# Patient Record
Sex: Female | Born: 1935 | Race: White | Hispanic: No | Smoking: Never smoker
Health system: Southern US, Community
[De-identification: ages and names within clinical notes are randomized; demographics above are authoritative.]

## PROBLEM LIST (undated history)

## (undated) DIAGNOSIS — R413 Other amnesia: Secondary | ICD-10-CM

## (undated) DIAGNOSIS — F419 Anxiety disorder, unspecified: Secondary | ICD-10-CM

## (undated) DIAGNOSIS — H3530 Unspecified macular degeneration: Secondary | ICD-10-CM

## (undated) DIAGNOSIS — M1990 Unspecified osteoarthritis, unspecified site: Secondary | ICD-10-CM

## (undated) DIAGNOSIS — E119 Type 2 diabetes mellitus without complications: Secondary | ICD-10-CM

## (undated) DIAGNOSIS — I499 Cardiac arrhythmia, unspecified: Secondary | ICD-10-CM

## (undated) HISTORY — PX: BACK SURGERY: SHX140

## (undated) HISTORY — PX: CHOLECYSTECTOMY: SHX55

## (undated) HISTORY — DX: Other amnesia: R41.3

## (undated) HISTORY — PX: ABDOMINAL HYSTERECTOMY: SHX81

## (undated) HISTORY — PX: CATARACT EXTRACTION: SUR2

---

## 1998-02-08 ENCOUNTER — Ambulatory Visit (HOSPITAL_COMMUNITY): Admission: RE | Admit: 1998-02-08 | Discharge: 1998-02-08 | Payer: Self-pay | Admitting: Internal Medicine

## 1998-09-25 ENCOUNTER — Encounter: Admission: RE | Admit: 1998-09-25 | Discharge: 1998-12-24 | Payer: Self-pay | Admitting: Internal Medicine

## 1998-11-28 ENCOUNTER — Other Ambulatory Visit: Admission: RE | Admit: 1998-11-28 | Discharge: 1998-11-28 | Payer: Self-pay | Admitting: Internal Medicine

## 1999-01-06 ENCOUNTER — Encounter (INDEPENDENT_AMBULATORY_CARE_PROVIDER_SITE_OTHER): Payer: Self-pay | Admitting: Specialist

## 1999-01-06 ENCOUNTER — Observation Stay (HOSPITAL_COMMUNITY): Admission: RE | Admit: 1999-01-06 | Discharge: 1999-01-07 | Payer: Self-pay | Admitting: *Deleted

## 1999-02-24 ENCOUNTER — Encounter: Admission: RE | Admit: 1999-02-24 | Discharge: 1999-05-25 | Payer: Self-pay | Admitting: Internal Medicine

## 1999-07-20 ENCOUNTER — Encounter: Admission: RE | Admit: 1999-07-20 | Discharge: 1999-10-18 | Payer: Self-pay | Admitting: Internal Medicine

## 1999-11-24 ENCOUNTER — Encounter: Admission: RE | Admit: 1999-11-24 | Discharge: 2000-02-22 | Payer: Self-pay | Admitting: Internal Medicine

## 2000-04-27 ENCOUNTER — Other Ambulatory Visit: Admission: RE | Admit: 2000-04-27 | Discharge: 2000-04-27 | Payer: Self-pay | Admitting: *Deleted

## 2000-09-02 ENCOUNTER — Ambulatory Visit (HOSPITAL_COMMUNITY): Admission: RE | Admit: 2000-09-02 | Discharge: 2000-09-02 | Payer: Self-pay | Admitting: Cardiology

## 2000-10-03 ENCOUNTER — Ambulatory Visit (HOSPITAL_COMMUNITY): Admission: RE | Admit: 2000-10-03 | Discharge: 2000-10-03 | Payer: Self-pay | Admitting: Cardiology

## 2001-06-26 ENCOUNTER — Other Ambulatory Visit: Admission: RE | Admit: 2001-06-26 | Discharge: 2001-06-26 | Payer: Self-pay | Admitting: *Deleted

## 2001-08-01 ENCOUNTER — Ambulatory Visit (HOSPITAL_COMMUNITY): Admission: RE | Admit: 2001-08-01 | Discharge: 2001-08-01 | Payer: Self-pay | Admitting: Gastroenterology

## 2001-08-01 ENCOUNTER — Encounter (INDEPENDENT_AMBULATORY_CARE_PROVIDER_SITE_OTHER): Payer: Self-pay | Admitting: Specialist

## 2004-06-04 ENCOUNTER — Emergency Department (HOSPITAL_COMMUNITY): Admission: EM | Admit: 2004-06-04 | Discharge: 2004-06-04 | Payer: Self-pay | Admitting: Emergency Medicine

## 2004-08-06 ENCOUNTER — Emergency Department (HOSPITAL_COMMUNITY): Admission: EM | Admit: 2004-08-06 | Discharge: 2004-08-06 | Payer: Self-pay | Admitting: Emergency Medicine

## 2004-09-28 ENCOUNTER — Emergency Department (HOSPITAL_COMMUNITY): Admission: EM | Admit: 2004-09-28 | Discharge: 2004-09-28 | Payer: Self-pay | Admitting: *Deleted

## 2004-10-14 ENCOUNTER — Ambulatory Visit: Payer: Self-pay | Admitting: Internal Medicine

## 2004-11-12 ENCOUNTER — Ambulatory Visit: Payer: Self-pay | Admitting: Cardiology

## 2004-11-13 ENCOUNTER — Ambulatory Visit: Payer: Self-pay

## 2005-06-16 ENCOUNTER — Other Ambulatory Visit: Admission: RE | Admit: 2005-06-16 | Discharge: 2005-06-16 | Payer: Self-pay | Admitting: Obstetrics and Gynecology

## 2008-09-25 ENCOUNTER — Encounter: Admission: RE | Admit: 2008-09-25 | Discharge: 2008-09-25 | Payer: Self-pay | Admitting: Otolaryngology

## 2008-12-05 ENCOUNTER — Ambulatory Visit (HOSPITAL_COMMUNITY): Admission: RE | Admit: 2008-12-05 | Discharge: 2008-12-05 | Payer: Self-pay | Admitting: Otolaryngology

## 2008-12-05 ENCOUNTER — Encounter (INDEPENDENT_AMBULATORY_CARE_PROVIDER_SITE_OTHER): Payer: Self-pay | Admitting: Interventional Radiology

## 2008-12-14 ENCOUNTER — Emergency Department (HOSPITAL_COMMUNITY): Admission: EM | Admit: 2008-12-14 | Discharge: 2008-12-14 | Payer: Self-pay | Admitting: Emergency Medicine

## 2009-02-19 ENCOUNTER — Encounter: Admission: RE | Admit: 2009-02-19 | Discharge: 2009-02-19 | Payer: Self-pay | Admitting: Otolaryngology

## 2009-03-28 ENCOUNTER — Ambulatory Visit: Payer: Self-pay | Admitting: Vascular Surgery

## 2009-12-31 DIAGNOSIS — R079 Chest pain, unspecified: Secondary | ICD-10-CM | POA: Insufficient documentation

## 2009-12-31 DIAGNOSIS — I2510 Atherosclerotic heart disease of native coronary artery without angina pectoris: Secondary | ICD-10-CM

## 2010-01-06 ENCOUNTER — Ambulatory Visit: Payer: Self-pay | Admitting: Internal Medicine

## 2010-01-06 DIAGNOSIS — I10 Essential (primary) hypertension: Secondary | ICD-10-CM

## 2010-01-06 LAB — CONVERTED CEMR LAB
BUN: 9 mg/dL (ref 6–23)
Glucose, Bld: 131 mg/dL — ABNORMAL HIGH (ref 70–99)
Potassium: 4.3 meq/L (ref 3.5–5.1)

## 2010-01-08 ENCOUNTER — Telehealth (INDEPENDENT_AMBULATORY_CARE_PROVIDER_SITE_OTHER): Payer: Self-pay | Admitting: *Deleted

## 2010-01-15 ENCOUNTER — Telehealth (INDEPENDENT_AMBULATORY_CARE_PROVIDER_SITE_OTHER): Payer: Self-pay | Admitting: *Deleted

## 2010-01-15 ENCOUNTER — Telehealth: Payer: Self-pay | Admitting: Internal Medicine

## 2010-01-19 ENCOUNTER — Ambulatory Visit (HOSPITAL_COMMUNITY)
Admission: RE | Admit: 2010-01-19 | Discharge: 2010-01-19 | Payer: Self-pay | Source: Home / Self Care | Admitting: Cardiology

## 2010-01-19 ENCOUNTER — Ambulatory Visit: Payer: Self-pay | Admitting: Cardiology

## 2010-07-17 ENCOUNTER — Telehealth: Payer: Self-pay | Admitting: Internal Medicine

## 2010-07-17 DIAGNOSIS — J984 Other disorders of lung: Secondary | ICD-10-CM | POA: Insufficient documentation

## 2010-07-29 ENCOUNTER — Ambulatory Visit: Payer: Self-pay | Admitting: Internal Medicine

## 2010-08-13 NOTE — Progress Notes (Signed)
Summary: pt hasquestions regarding test on Monday  Phone Note Call from Patient Call back at Home Phone [phone]   Caller: Patient Reason for Call: Talk to Nurse, Talk to Doctor Summary of Call: pt has questions regarding medication and about scan she is having monday. She is in research study the best time for you to call is between 4p - 5p Initial call taken by: Omer Jack,  January 15, 2010 11:38 AM  Follow-up for Phone Call        Vivan in research spoke w/pt Meredith Staggers, RN  January 15, 2010 5:54 PM

## 2010-08-13 NOTE — Progress Notes (Signed)
  Phone Note Outgoing Call   Summary of Call: Pt was randomized to coronary CTA for Promise Study and will have on July 11,2011 at 2pm. Pt was given instructions for coronary CTA. Pt will take morning insulin and eat a good meal. Pt is not to eat 4 hrs prior to test. Pt can drink water. Pt verbalized understanding. Pt does have latex allergy and radiology notified of allergy.

## 2010-08-13 NOTE — Progress Notes (Signed)
  Phone Note Outgoing Call   Summary of Call: I returned patient's phone call. Pt is to start Cozaar per Dr. Prescott Gum orders to lower blood pressure.Pt can take Xanax prior to procedure to help pt relax. Pt verbalized understanding.

## 2010-08-13 NOTE — Progress Notes (Signed)
Summary: sch CT scan  Phone Note Outgoing Call   Call placed by: Meredith Staggers, RN,  July 17, 2010 5:23 PM Call placed to: Patient Summary of Call: called pt to let her know it was time to do a CT scan to f/u on the lung nodule, she is aware, order placed  New Problems: LUNG NODULE (ICD-518.89)   New Problems: LUNG NODULE (ICD-518.89)

## 2010-08-13 NOTE — Assessment & Plan Note (Signed)
Summary: EC6/ GD   Primary Provider:  Larita Fife, MD  CC:  Pain at (L) Breast; PALPS.  History of Present Illness: Lauren Thomas is 76 y/o woman who was referred by Dr. Cyndia Bent for further evaluation of CP and palpitations.   Has h/o diabetes, obesity, HLand panic attacks. Underwent cath in 2002 with Dr. Donnie Aho after Myoview showed questionable anterior defect. Mild luminal irregualties but no obstructive cornary disease. Normal LVEF. Also wore a monitor in 2006 for palpitations which was normal despite multiple episodes of "palpitations". Had echo in 2006 which was also normal.   Over past 6 months she notes that she is having episodes of chest pain under her left breast. Typically happens every day. Not exertional.  No nocturnal angina. Comes and goes; seems to improve if she burps or has her back rubbed. Has rotator cuff problems as well and is getting injections with Dr. Ethelene Hal. Not overly active due to arthritis and weight. About to start exercise program with Dr. Kinnie Scales.   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 Occasional palpitations when she has a panic attack. No syncope. Notes that her BP has recently been high. PCP started low-dose lisinopril but developed a cough.    Allergies (verified): 1)  ! Sulfa 2)  ! Penicillin  Past History:  Past Surgical History: Last updated: 12/31/2009 Vaginal hysterectomy. Lumbar laminectomy. Cholecystectomy Knee Arthroplasty-Total - Left  Family History: Last updated: 12/31/2009 Family History of Cancer:  Family History of Coronary Artery Disease:  Family History of Diabetes:  Family History of Hypertension:   Social History: Last updated: 12/31/2009 Tobacco Use - No.  Alcohol Use - yes  Risk Factors: Smoking Status: never (12/31/2009)  Past Medical History: 1. Chest discomfort  -- mild CAD per cath '02 2. Type 2 diabetes mellitus, insulin-dependent. 3. Morbid obesity. 4.  Osteoarthritis. 5. Recent sinus and tooth abscess. 6. Possible dyslipidemia in the past. 7. Palpitations       --holter monitor and echo normal 2006 8. Hyperlipidemia  Family History: Reviewed history from 12/31/2009 and no changes required. Family History of Cancer:  Family History of Coronary Artery Disease:  Family History of Diabetes:  Family History of Hypertension:   Social History: Reviewed history from 12/31/2009 and no changes required. Tobacco Use - No.  Alcohol Use - yes  Review of Systems       As per HPI and past medical history; otherwise all systems negative.   Vital Signs:  Patient profile:   76 year old female Height:      66 inches Weight:      245 pounds BMI:     39.69 Pulse rate:   84 / minute BP sitting:   146 / 75  (right arm) Cuff size:   large  Vitals Entered By: Stanton Kidney, EMT-P (January 06, 2010 10:56 AM)  Physical Exam  General:  Obese. No aciute distress.. no resp difficulty HEENT: normal Neck: supple. no JVD. Carotids 2+ bilat; no bruits. No lymphadenopathy or thryomegaly appreciated. Cor: PMI nondisplaced. Regular rate & rhythm. No rubs, gallops, murmur. Lungs: clear Abdomen: obese soft, nontender, nondistended. Good bowel sounds. Extremities: no cyanosis, clubbing, rash, edema Neuro: alert & orientedx3, cranial nerves grossly intact. moves all 4 extremities w/o difficulty. affect pleasant    Impression & Recommendations:  Problem # 1:  CHEST PAIN-UNSPECIFIED (ICD-786.50) Mostly atypical but patient is diabetic with multiple risk factors. Will proceed with Lexiscan (can't walk on treadmill)  Myoview to evaluate. Will consider enrollment into Promise trial (stress test vs cardiac CT). Start ECASA 81. Given DM2 consider statin. She will d/w PCP.   Problem # 2:  HYPERTENSION, BENIGN (ICD-401.1) BP mildly elevated. Given h/o DM2 and intolerance of ACE-I will start Cozaar 25mg  once daily.   Other Orders: TLB-BMP (Basic Metabolic  Panel-BMET) (80048-METABOL)  Patient Instructions: 1)  Lab today 2)  Start Losartan 25mg  daily 3)  Your physician discussed the risks, benefits and indications for preventive aspirin therapy. It is recommended that you start (or continue) taking 81 mg of aspirin a day. Prescriptions: LOSARTAN POTASSIUM 25 MG TABS (LOSARTAN POTASSIUM) Take 1 tablet by mouth once a day  #30 x 6   Entered by:   Meredith Staggers, RN   Authorized by:   Dolores Patty, MD, [facility] -    Signed by:   Meredith Staggers, RN on 01/06/2010   Method used:   Electronically to        [facility] [address] [phone]* (retail)       [address] [place], [place]  [address]       Ph: [phone]       Fax: [phone]   RxID:   [id number]   Appended Document: ORDERS    Clinical Lists Changes  Orders: Added new Referral order of Cardiac CTA (Cardiac CTA) - Signed

## 2010-08-27 ENCOUNTER — Institutional Professional Consult (permissible substitution): Payer: Self-pay | Admitting: Emergency Medicine

## 2010-09-14 ENCOUNTER — Institutional Professional Consult (permissible substitution) (INDEPENDENT_AMBULATORY_CARE_PROVIDER_SITE_OTHER): Payer: Medicare PPO | Admitting: Emergency Medicine

## 2010-09-14 ENCOUNTER — Encounter: Payer: Self-pay | Admitting: Emergency Medicine

## 2010-09-14 DIAGNOSIS — J984 Other disorders of lung: Secondary | ICD-10-CM

## 2010-09-22 NOTE — Assessment & Plan Note (Signed)
Summary: pulmonary nodules   Visit Type:  Initial Consult Copy to:  Dr. Gala Romney Primary Provider/Referring Provider:  Larita Fife, MD  CC:  Pulmonary consult for abnormal CT chest...pulmonary nodule.  History of Present Illness: 09/14/10: Lauren Thomas is a 76 y/o woman with PMH of IDDM, who came to the clinic due to abnormal CT chest  She participated in a Cardiac research study in 7/11 and got a CT chest done which showed a 8x8 mm nodule in RLL- which remained to the same size in a repeat scan 6 months later- in 01/12.  She never smoked cigarretes, but was exposed to second hand smoke in her earlier years of life from her father and her first husband for about total of 27 yrs. She denies any cough, SOB,, chest pain.  Also denies any fever,  N/V,diarrhea, urinary abn, anorexia, wt loss, headache.    Preventive Screening-Counseling & Management  Alcohol-Tobacco     Smoking Status: never     Passive Smoke Exposure: yes  Current Medications (verified): 1)  Novolin 70/30 70-30 % Susp (Insulin Isophane & Regular) .... As Directed 2)  Alprazolam 0.25 Mg Tabs (Alprazolam) .... Take 1/2 Tablet Up To Three Times A Day  As Needed 3)  Hyoscyamine Sulfate 0.125 Mg Tabs (Hyoscyamine Sulfate) .... As Needed 4)  Aspirin 81 Mg Tbec (Aspirin) .... Take One Tablet By Mouth Daily 5)  Nitrofurantoin Macrocrystal 100 Mg Caps (Nitrofurantoin Macrocrystal) .Marland Kitchen.. 1 By Mouth Two Times A Day  Allergies (verified): 1)  ! Sulfa 2)  ! Penicillin  Past History:  Past Medical History: Last updated: 01/06/2010 1. Chest discomfort  -- mild CAD per cath '02 2. Type 2 diabetes mellitus, insulin-dependent. 3. Morbid obesity. 4. Osteoarthritis. 5. Recent sinus and tooth abscess. 6. Possible dyslipidemia in the past. 7. Palpitations       --holter monitor and echo normal 2006 8. Hyperlipidemia  Family History: Last updated: 12/31/2009 Family History of Cancer:  Family History of Coronary Artery Disease:    Family History of Diabetes:  Family History of Hypertension:   Family History: Reviewed history from 12/31/2009 and no changes required. Family History of Cancer:  Family History of Coronary Artery Disease:  Family History of Diabetes:  Family History of Hypertension:   Social History: Reviewed history from 12/31/2009 and no changes required. Tobacco Use - No.  Alcohol Use - yes Patient never smoked.  Positive history of passive tobacco smoke exposure.  Passive Smoke Exposure:  yes  Review of Systems       The patient complains of abdominal pain, nasal congestion/difficulty breathing through nose, anxiety, and depression.    Vital Signs:  Patient profile:   76 year old female Height:      66 inches (167.64 cm) Weight:      242 pounds (110.00 kg) BMI:     39.20 O2 Sat:      97 % on Room air Temp:     98.3 degrees F (36.83 degrees C) oral Pulse rate:   91 / minute BP sitting:   130 / 82  (right arm) Cuff size:   large  Vitals Entered By: Michel Bickers CMA (September 14, 2010 3:20 PM)  O2 Sat at Rest %:  97 O2 Flow:  Room air CC: Pulmonary consult for abnormal CT chest...pulmonary nodule Is Patient Diabetic? Yes Comments Medications reviewed with patient Michel Bickers [facility]  September 14, 2010 3:34 PM Michel Bickers CMA  September 14, 2010 3:34 PM  Physical Exam  Additional Exam:  Gen: Patient is in NAD, Pleasant. Eyes: PERRL, EOMI, No signs of anemia or jaundince. ENT: MMM, OP clear, No erythema, thrush or exudates. Neck: Supple, No carotid Bruits Resp: CTA- Bilaterally, No W/C/R. CVS: S1S2 RRR GI: Abdomen is soft. ND. Ext: No pedal edema, cyanosis or clubbing. GU: No CVA tenderness. Skin: No visible rashes, scars. Lymph: No palpable lymphadenopathy. Lauren: Moving all 4 extremities. Neuro: A&O X3, CN II - XII are grossly intact. Psych: Appropriate    Impression & Recommendations:  Problem # 1:  LUNG NODULE (ICD-518.89)  Lauren Thomas has an incidental findings of lung nodules  on CT Chest in 7/11 as per HPI.  - Considering her PMH and as she being a  non-smoker for life, will need a repeat CT chest done about 18 months from now  as she had a repeat scan done 6 months after the 1st one and the repeat scan showed no change in the nodule.  Orders: Consultation Level IV (08657)  Medications Added to Medication List This Visit: 1)  Nitrofurantoin Macrocrystal 100 Mg Caps (Nitrofurantoin macrocrystal) .Marland Kitchen.. 1 by mouth two times a day  Patient Instructions: 1)  Your lung nodules seen on CT scan need to be followed by a repeat CT after about 18 months from the last one in 1/12- which would be 01/2012. 2)  -So please make call the office in June 2013 to schedule for CT scan of your chest to look for any changes in the nodiules.  Appended Document: pulmonary nodules I have seen and examined Lauren Thomas with Dr Allena Katz. I agree with the database, plans as outlined above. She has a very medial RLL 8mm nodule that is stable on 57month f/u CT scan. She is a never-smoker, although she did have 2nd hand exposure. Based on the Fleischner Criteria, she needs a repeat scan in 18 - 24 months. I'd like to do this in 18 months, which would be in July 2013 (if stable this would be 2 yrs stability). If she develops symptoms then I will get a CT sooner. RSB

## 2010-10-19 LAB — BASIC METABOLIC PANEL
Chloride: 104 mEq/L (ref 96–112)
Creatinine, Ser: 0.72 mg/dL (ref 0.4–1.2)
GFR calc Af Amer: >60 mL/min (ref >60)
Potassium: 4.2 mEq/L (ref 3.5–5.1)

## 2010-10-19 LAB — DIFFERENTIAL
Eosinophils Absolute: 0.1 10*3/uL (ref 0.0–0.7)
Lymphs Abs: 2.5 10*3/uL (ref 0.7–4.0)
Monocytes Absolute: 0.6 10*3/uL (ref 0.1–1.0)
Monocytes Relative: 6 % (ref 3–12)
Neutrophils Relative %: 68 % (ref 43–77)

## 2010-10-19 LAB — CULTURE, ROUTINE-ABSCESS

## 2010-10-19 LAB — CBC
Hemoglobin: 14.5 g/dL (ref 12.0–15.0)
MCHC: 34.8 g/dL (ref 30.0–36.0)
RDW: 13.5 % (ref 11.5–15.5)

## 2010-10-20 LAB — CULTURE, ROUTINE-ABSCESS: Culture: NO GROWTH

## 2010-10-20 LAB — GLUCOSE, CAPILLARY: Glucose-Capillary: 126 mg/dL — ABNORMAL HIGH (ref 70–99)

## 2010-10-20 LAB — ANAEROBIC CULTURE

## 2010-10-20 LAB — CBC
MCHC: 34.4 g/dL (ref 30.0–36.0)
RBC: 4.77 MIL/uL (ref 3.87–5.11)
WBC: 9 10*3/uL (ref 4.0–10.5)

## 2010-10-20 LAB — FUNGUS CULTURE W SMEAR

## 2010-10-20 LAB — AFB CULTURE WITH SMEAR (NOT AT ARMC)

## 2010-11-27 NOTE — H&P (Signed)
[place]  Patient:    Lauren Thomas, Lauren Thomas                        MRN: [id number] Adm. Date:  09/02/00 Attending:  Darden Palmer., M.D. CC:         Ammie Dalton, M.D.   History and Physical  REASON FOR ADMISSION:  For cardiac catheterization.  HISTORY OF PRESENT ILLNESS:  This 76 year old female has a five to six-year history of diabetes mellitus and obesity.  She developed a recent sinus infection and abscess involving her left tooth, associated with jaw pain.  She developed episodic chest discomfort one month prior to admission, described as gas in her left chest, radiating to her left arm.  It would tend to occur at rest, and would be associated with excessive gas, belching, and would often be relieved with belching.  She did not have exertional activity-related symptoms.  This discomfort would last for up to 15 minutes.  An electrocardiogram was noted to be abnormal in Dr. Ammie Dalton office with anterolateral T-wave abnormality.  An adenosine Cardiolyte scan done on August 15, 2000, on a two-day protocol because of her weight showed a reversible anterior and apical defect compatible with ischemia.  A cardiac catheterization is now advised, to exclude coronary artery disease as the cause in this patient with diabetes mellitus and an abnormal Cardiolyte scan.  PAST MEDICAL HISTORY: 1. Diabetes mellitus for approximately five years, treated with insulin. 2. She has mild hyperlipidemia. 3. Obesity.  There is no history of hypertension.  PAST SURGICAL HISTORY: 1. Vaginal hysterectomy. 2. Lumbar laminectomy. 3. Cholecystectomy. 4. Left knee arthroscopy.  ALLERGIES:  No known drug allergies.  CURRENT MEDICATIONS: 1. Humulin 70/30, 50 units q.a.m., 30 units q. evening. 2. Sudafed p.r.n. 3. Doxycycline previously.  FAMILY HISTORY:  Father died of cancer of the prostate at age 61.  Mother died at age 48 of diabetes  mellitus and hypotension.  Three brothers living, one with a bypass surgery twice, and another brother has had bypass, and another brother has had heart disease.  She has three sons.  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  She currently lives in a house with another house mate.  She is a nonsmoker.  She does not use alcohol to excess.  After an absence, she started attending [facility] again. She drinks occasional caffeine.  REVIEW OF SYSTEMS:  Obese and quite large for several years.  She has had a sinus infection and jaw pain.  No difficulty swallowing.  She has had some dyspepsia and occasionally takes Pepcid for this.  No diarrhea or constipation.  She reportedly had diarrhea when taking Nexium.  She finds it difficult to walk because of pain involving her right knee.  She has no claudication, no TIAs, no significant edema.  No endocrine complaints.  No skin complaints.  No neurological complaints.  The remainder of the review of systems is unremarkable, except as noted above.  PHYSICAL EXAMINATION:  GENERAL:  She is an obese woman, who weighs 244 pounds.  VITAL SIGNS:  Blood pressure 118/70 sitting, 114/72 standing, pulse 80.  SKIN:  Warm and dry without mass or lesions.  HEENT:  EOMI.  PERRLA.  C&S clear.  Fundi unremarkable.  No diabetic retinopathy noted.  Pharynx negative.  NECK:  Supple without masses, thyromegaly, or bruits.  NODES:  Lymph nodes are unremarkable.  LUNGS:  Clear to A&P.  CARDIOVASCULAR:  Normal S1, S2.  No S3, S4, or murmur.  ABDOMEN:  Soft, nontender.  Very large.  No definite aneurysm is noted.  EXTREMITIES:  Femoral pulses are deep.  Distal pulses are 1+.  No edema noted. No Homans sign.  BREASTS/PELVIC:  Examinations not done because of the cardiac status.  A 12-lead electrocardiogram shows minor ST changes.  A chest x-ray reveals borderline  heart size.  IMPRESSION: 1. Chest discomfort with atypical features, with an abnormal Cardiolyte    scan, in a patient with diabetes mellitus and a family history.  Rule    out significant coronary artery disease. 2. Type 2 diabetes mellitus, insulin-dependent. 3. Morbid obesity. 4. Osteoarthritis. 5. Recent sinus and tooth abscess. 6. Possible dyslipidemia in the past.  RECOMMENDATIONS:  The patient will be scheduled to have a cardiac catheterization to exclude coronary artery disease.  The procedure was discussed with the patient fully, including the risks of myocardial infarction, death, or CVA.  She is agreeable and willing to proceed.  The possibility of stenting, bypass grafting, and risks of stenting were discussed with her, including restenosis and its management, which are increased somewhat in diabetics.  She is agreeable and willing to proceed.  Laboratory data was done as a preoperative evaluation. DD:  08/26/00 TD:  08/26/00 Job: [id number]

## 2010-11-27 NOTE — Cardiovascular Report (Signed)
[place]  Patient:    [name], [name]                      MRN: [id number] Proc. Date: 09/02/00 Adm. Date:  [id number] Disc. Date: [id number] Attending:  Norman Clay CC:         Ammie Dalton, M.D.   Cardiac Catheterization  HISTORY:  A 76 year old diabetic female, who has had chest discomfort with some atypical features.  She has had a Cardiolite scan showing some possible anterior wall ischemia.  COMMENTS ABOUT PROCEDURE:  The patient tolerated the procedure well without complications.  Please see the attached catheterization log for medications used and catheters used.  The right coronary artery was difficult to flex and required a No Torque for catheter.  She tolerated the procedure well.  HEMODYNAMIC DATA:  Aorta post contrast 160/78, LV post contrast 160/10.  ANGIOGRAPHIC DATA:  LEFT VENTRICULOGRAM:  The left ventriculogram performed in the 30 degree RAO projection.  The aortic valve was normal.  The mitral valve appears normal. The left ventricle appears normal in size.  The estimated ejection fraction was 60%.  Coronary arteries arise and distribute normally.  There was no significant coronary calcification present.  The left main coronary artery is normal.  The left anterior descending contains no significant stenoses.  There is aneurysmal dilatation in the proximal portion of the vessel and moderate irregularity is present.  The circumflex coronary artery has mild to moderate irregularity but no significant focal stenosis are noted.  The right coronary artery is a large dominant vessel with the posterior descending branch and appears normal.  IMPRESSION: 1. Mild coronary artery disease, no significant focal obstructive    stenoses noted. 2. Normal left ventricular function. DD:  09/02/00 TD:  09/03/00 Job: [id number]

## 2013-10-12 ENCOUNTER — Emergency Department (HOSPITAL_COMMUNITY): Payer: Medicare Other

## 2013-10-12 ENCOUNTER — Encounter (HOSPITAL_COMMUNITY): Payer: Self-pay | Admitting: Emergency Medicine

## 2013-10-12 ENCOUNTER — Inpatient Hospital Stay (HOSPITAL_COMMUNITY)
Admission: EM | Admit: 2013-10-12 | Discharge: 2013-10-15 | DRG: 287 | Disposition: A | Discharge status: Home / Self Care | Payer: Medicare Other | Attending: Cardiology | Admitting: Cardiology

## 2013-10-12 DIAGNOSIS — F411 Generalized anxiety disorder: Secondary | ICD-10-CM | POA: Diagnosis present

## 2013-10-12 DIAGNOSIS — E669 Obesity, unspecified: Secondary | ICD-10-CM | POA: Diagnosis present

## 2013-10-12 DIAGNOSIS — R7989 Other specified abnormal findings of blood chemistry: Secondary | ICD-10-CM | POA: Diagnosis present

## 2013-10-12 DIAGNOSIS — E119 Type 2 diabetes mellitus without complications: Secondary | ICD-10-CM | POA: Diagnosis present

## 2013-10-12 DIAGNOSIS — E1169 Type 2 diabetes mellitus with other specified complication: Secondary | ICD-10-CM | POA: Diagnosis present

## 2013-10-12 DIAGNOSIS — H3530 Unspecified macular degeneration: Secondary | ICD-10-CM | POA: Diagnosis present

## 2013-10-12 DIAGNOSIS — Z888 Allergy status to other drugs, medicaments and biological substances status: Secondary | ICD-10-CM

## 2013-10-12 DIAGNOSIS — I2510 Atherosclerotic heart disease of native coronary artery without angina pectoris: Secondary | ICD-10-CM | POA: Diagnosis present

## 2013-10-12 DIAGNOSIS — R079 Chest pain, unspecified: Secondary | ICD-10-CM

## 2013-10-12 DIAGNOSIS — R911 Solitary pulmonary nodule: Secondary | ICD-10-CM | POA: Diagnosis present

## 2013-10-12 DIAGNOSIS — R748 Abnormal levels of other serum enzymes: Secondary | ICD-10-CM | POA: Diagnosis present

## 2013-10-12 DIAGNOSIS — Z880 Allergy status to penicillin: Secondary | ICD-10-CM

## 2013-10-12 DIAGNOSIS — R778 Other specified abnormalities of plasma proteins: Secondary | ICD-10-CM | POA: Diagnosis present

## 2013-10-12 DIAGNOSIS — Z6838 Body mass index (BMI) 38.0-38.9, adult: Secondary | ICD-10-CM

## 2013-10-12 DIAGNOSIS — J984 Other disorders of lung: Secondary | ICD-10-CM | POA: Diagnosis present

## 2013-10-12 DIAGNOSIS — Z79899 Other long term (current) drug therapy: Secondary | ICD-10-CM

## 2013-10-12 DIAGNOSIS — R9431 Abnormal electrocardiogram [ECG] [EKG]: Secondary | ICD-10-CM

## 2013-10-12 DIAGNOSIS — I10 Essential (primary) hypertension: Secondary | ICD-10-CM | POA: Diagnosis present

## 2013-10-12 DIAGNOSIS — I499 Cardiac arrhythmia, unspecified: Secondary | ICD-10-CM | POA: Diagnosis present

## 2013-10-12 DIAGNOSIS — R12 Heartburn: Secondary | ICD-10-CM | POA: Diagnosis present

## 2013-10-12 DIAGNOSIS — R42 Dizziness and giddiness: Secondary | ICD-10-CM

## 2013-10-12 DIAGNOSIS — R002 Palpitations: Principal | ICD-10-CM

## 2013-10-12 DIAGNOSIS — Z794 Long term (current) use of insulin: Secondary | ICD-10-CM

## 2013-10-12 DIAGNOSIS — M129 Arthropathy, unspecified: Secondary | ICD-10-CM | POA: Diagnosis present

## 2013-10-12 HISTORY — DX: Anxiety disorder, unspecified: F41.9

## 2013-10-12 HISTORY — DX: Unspecified macular degeneration: H35.30

## 2013-10-12 HISTORY — DX: Type 2 diabetes mellitus without complications: E11.9

## 2013-10-12 HISTORY — DX: Unspecified osteoarthritis, unspecified site: M19.90

## 2013-10-12 HISTORY — DX: Cardiac arrhythmia, unspecified: I49.9

## 2013-10-12 LAB — PROTIME-INR
INR: 0.94 (ref 0.00–1.49)
Prothrombin Time: 12.4 seconds (ref 11.6–15.2)

## 2013-10-12 LAB — URINALYSIS, ROUTINE W REFLEX MICROSCOPIC
Bilirubin Urine: NEGATIVE
Glucose, UA: NEGATIVE mg/dL
Hgb urine dipstick: NEGATIVE
Ketones, ur: NEGATIVE mg/dL
Nitrite: NEGATIVE
Protein, ur: NEGATIVE mg/dL
Specific Gravity, Urine: 1.012 (ref 1.005–1.030)
Urobilinogen, UA: 0.2 mg/dL (ref 0.0–1.0)
pH: 5.5 (ref 5.0–8.0)

## 2013-10-12 LAB — COMPREHENSIVE METABOLIC PANEL WITH GFR
ALT: 16 U/L (ref 0–35)
AST: 19 U/L (ref 0–37)
Albumin: 3.4 g/dL — ABNORMAL LOW (ref 3.5–5.2)
Alkaline Phosphatase: 64 U/L (ref 39–117)
BUN: 11 mg/dL (ref 6–23)
CO2: 23 meq/L (ref 19–32)
Calcium: 9.1 mg/dL (ref 8.4–10.5)
Chloride: 99 meq/L (ref 96–112)
Creatinine, Ser: 0.67 mg/dL (ref 0.50–1.10)
GFR calc Af Amer: >90 mL/min
GFR calc non Af Amer: 83 mL/min — ABNORMAL LOW
Glucose, Bld: 216 mg/dL — ABNORMAL HIGH (ref 70–99)
Potassium: 4.4 meq/L (ref 3.7–5.3)
Sodium: 137 meq/L (ref 137–147)
Total Bilirubin: 0.6 mg/dL (ref 0.3–1.2)
Total Protein: 7.3 g/dL (ref 6.0–8.3)

## 2013-10-12 LAB — CBC WITH DIFFERENTIAL/PLATELET
Basophils Absolute: 0 K/uL (ref 0.0–0.1)
Basophils Relative: 0 % (ref 0–1)
Eosinophils Absolute: 0.1 K/uL (ref 0.0–0.7)
Eosinophils Relative: 1 % (ref 0–5)
HCT: 42.1 % (ref 36.0–46.0)
Hemoglobin: 15.1 g/dL — ABNORMAL HIGH (ref 12.0–15.0)
Lymphocytes Relative: 21 % (ref 12–46)
Lymphs Abs: 2.1 K/uL (ref 0.7–4.0)
MCH: 30.8 pg (ref 26.0–34.0)
MCHC: 35.9 g/dL (ref 30.0–36.0)
MCV: 85.9 fL (ref 78.0–100.0)
Monocytes Absolute: 0.5 K/uL (ref 0.1–1.0)
Monocytes Relative: 5 % (ref 3–12)
Neutro Abs: 7.4 K/uL (ref 1.7–7.7)
Neutrophils Relative %: 73 % (ref 43–77)
Platelets: 178 K/uL (ref 150–400)
RBC: 4.9 MIL/uL (ref 3.87–5.11)
RDW: 13.9 % (ref 11.5–15.5)
WBC: 10.2 K/uL (ref 4.0–10.5)

## 2013-10-12 LAB — URINE MICROSCOPIC-ADD ON

## 2013-10-12 LAB — TROPONIN I
TROPONIN I: 0.49 ng/mL — AB (ref <0.30)
Troponin I: 0.73 ng/mL (ref <0.30)

## 2013-10-12 LAB — GLUCOSE, CAPILLARY
GLUCOSE-CAPILLARY: 214 mg/dL — AB (ref 70–99)
Glucose-Capillary: 199 mg/dL — ABNORMAL HIGH (ref 70–99)

## 2013-10-12 LAB — TSH: TSH: 1.14 u[IU]/mL (ref 0.350–4.500)

## 2013-10-12 MED ORDER — ASPIRIN 81 MG PO CHEW: 324.0000 mg | CHEWABLE_TABLET | ORAL | Status: AC

## 2013-10-12 MED ORDER — METOPROLOL TARTRATE 12.5 MG HALF TABLET
12.5000 mg | ORAL_TABLET | Freq: Two times a day (BID) | ORAL | Status: DC
  Administered 2013-10-12 – 2013-10-14 (×5): 12.5 mg via ORAL
  Filled 2013-10-12 (×8): qty 1

## 2013-10-12 MED ORDER — POLYETHYL GLYCOL-PROPYL GLYCOL 0.4-0.3 % OP SOLN: 1.0000 [drp] | Freq: Every day | OPHTHALMIC | Status: DC | PRN

## 2013-10-12 MED ORDER — INSULIN ASPART PROT & ASPART (70-30 MIX) 100 UNIT/ML ~~LOC~~ SUSP
40.0000 [IU] | Freq: Every day | SUBCUTANEOUS | Status: DC
  Administered 2013-10-12 – 2013-10-14 (×3): 40 [IU] via SUBCUTANEOUS
  Filled 2013-10-12 (×2): qty 10

## 2013-10-12 MED ORDER — ACETAMINOPHEN 325 MG PO TABS
650.0000 mg | ORAL_TABLET | ORAL | Status: DC | PRN
  Filled 2013-10-12: qty 2

## 2013-10-12 MED ORDER — ASPIRIN EC 81 MG PO TBEC
81.0000 mg | DELAYED_RELEASE_TABLET | Freq: Every day | ORAL | Status: DC
  Administered 2013-10-13 – 2013-10-14 (×2): 81 mg via ORAL
  Filled 2013-10-12 (×3): qty 1

## 2013-10-12 MED ORDER — PNEUMOCOCCAL VAC POLYVALENT 25 MCG/0.5ML IJ INJ
0.5000 mL | INJECTION | INTRAMUSCULAR | Status: DC
  Filled 2013-10-12: qty 0.5

## 2013-10-12 MED ORDER — ALPRAZOLAM 0.25 MG PO TABS
0.2500 mg | ORAL_TABLET | Freq: Two times a day (BID) | ORAL | Status: DC | PRN
  Administered 2013-10-13 – 2013-10-14 (×4): 0.25 mg via ORAL
  Filled 2013-10-12 (×4): qty 1

## 2013-10-12 MED ORDER — INSULIN ASPART 100 UNIT/ML ~~LOC~~ SOLN: 0.0000 [IU] | Freq: Every day | SUBCUTANEOUS | Status: DC

## 2013-10-12 MED ORDER — INSULIN ASPART PROT & ASPART (70-30 MIX) 100 UNIT/ML ~~LOC~~ SUSP: 50.0000 [IU] | Freq: Every day | SUBCUTANEOUS | Status: DC

## 2013-10-12 MED ORDER — HEPARIN (PORCINE) IN NACL 100-0.45 UNIT/ML-% IJ SOLN
1300.0000 [IU]/h | INTRAMUSCULAR | Status: DC
  Administered 2013-10-12: 1100 [IU]/h via INTRAVENOUS
  Administered 2013-10-13 (×2): 1300 [IU]/h via INTRAVENOUS
  Filled 2013-10-12 (×3): qty 250

## 2013-10-12 MED ORDER — HYOSCYAMINE SULFATE 0.125 MG PO TBDP
0.1250 mg | ORAL_TABLET | Freq: Four times a day (QID) | ORAL | Status: DC | PRN
  Filled 2013-10-12: qty 1

## 2013-10-12 MED ORDER — INSULIN ASPART PROT & ASPART (70-30 MIX) 100 UNIT/ML ~~LOC~~ SUSP
60.0000 [IU] | Freq: Every day | SUBCUTANEOUS | Status: DC
  Administered 2013-10-14: 60 [IU] via SUBCUTANEOUS
  Filled 2013-10-12: qty 10

## 2013-10-12 MED ORDER — INSULIN ASPART PROT & ASPART (70-30 MIX) 100 UNIT/ML ~~LOC~~ SUSP: 70.0000 [IU] | Freq: Every day | SUBCUTANEOUS | Status: DC

## 2013-10-12 MED ORDER — HEPARIN BOLUS VIA INFUSION
4000.0000 [IU] | Freq: Once | INTRAVENOUS | Status: AC
  Administered 2013-10-12: 4000 [IU] via INTRAVENOUS
  Filled 2013-10-12: qty 4000

## 2013-10-12 MED ORDER — NITROGLYCERIN 0.4 MG SL SUBL: 0.4000 mg | SUBLINGUAL_TABLET | SUBLINGUAL | Status: DC | PRN

## 2013-10-12 MED ORDER — INSULIN ASPART 100 UNIT/ML ~~LOC~~ SOLN
0.0000 [IU] | Freq: Three times a day (TID) | SUBCUTANEOUS | Status: DC
  Administered 2013-10-13 – 2013-10-14 (×3): 3 [IU] via SUBCUTANEOUS
  Administered 2013-10-14: 2 [IU] via SUBCUTANEOUS
  Administered 2013-10-14: 3 [IU] via SUBCUTANEOUS
  Administered 2013-10-15: 2 [IU] via SUBCUTANEOUS

## 2013-10-12 MED ORDER — POLYVINYL ALCOHOL 1.4 % OP SOLN
1.0000 [drp] | OPHTHALMIC | Status: DC | PRN
  Filled 2013-10-12: qty 15

## 2013-10-12 MED ORDER — ASPIRIN 300 MG RE SUPP: 300.0000 mg | RECTAL | Status: AC

## 2013-10-12 MED ORDER — ONDANSETRON HCL 4 MG/2ML IJ SOLN
4.0000 mg | Freq: Once | INTRAMUSCULAR | Status: AC
  Administered 2013-10-12: 4 mg via INTRAVENOUS
  Filled 2013-10-12: qty 2

## 2013-10-12 MED ORDER — ONDANSETRON HCL 4 MG/2ML IJ SOLN: 4.0000 mg | Freq: Four times a day (QID) | INTRAMUSCULAR | Status: DC | PRN

## 2013-10-12 MED ORDER — ASPIRIN 81 MG PO CHEW: 324.0000 mg | CHEWABLE_TABLET | Freq: Once | ORAL | Status: DC

## 2013-10-12 MED ORDER — GUAIFENESIN ER 600 MG PO TB12
600.0000 mg | ORAL_TABLET | Freq: Two times a day (BID) | ORAL | Status: DC | PRN
  Filled 2013-10-12: qty 1

## 2013-10-12 MED ORDER — ATORVASTATIN CALCIUM 10 MG PO TABS
10.0000 mg | ORAL_TABLET | Freq: Every day | ORAL | Status: DC
  Administered 2013-10-13 – 2013-10-14 (×2): 10 mg via ORAL
  Filled 2013-10-12 (×3): qty 1

## 2013-10-12 NOTE — H&P (Signed)
Patient ID: Lauren Thomas MRN: [id number], DOB/AGE: January 09, 1935   Admit date: 10/12/2013   Primary Physician: Dr Cyndia Bent Primary Cardiologist: Dr Gala Romney  HPI: 79 y/o female we have seen in the past. Her son is a Insurance underwriter. She had a cath in 2002 that revealed minor irregularities. She was seen again in July 2011 and had a coronary CT revealing mild to at most moderate Ca++ but no flow limiting lesions. She is obese and very sedentary. She has had a long history of palpations but has never had documented arrythmia. Early this am she got up to use the bathroom and went to check her pulse. She was not having palpitations, dizziness, or chest pain, she says she just frequently checks her pulse by palpitating her carotid. This morning she became concerned because she couldn't find her pulse. She called her primary care provider and was instructed to call EMS. EMS rhythm strips show ST elevation in lead 3 but this is not seen on several repeated 12 leads. The did admit to some vague epigastric discomfort when pressed. Her Troponin in the ER is 0.49.    Problem List: Past Medical History  Diagnosis Date  . Irregular heart rhythm   . Diabetes mellitus without complication   . Arthritis   . Macular degeneration   . Anxiety     Past Surgical History  Procedure Laterality Date  . Back surgery    . Abdominal hysterectomy    . Cholecystectomy       Allergies:  Allergies  Allergen Reactions  . Erythromycin     Heart flutter  . Penicillins     unknown  . Sulfonamide Derivatives     diarrhea  . Latex Rash  . Neosporin [Neomycin-Bacitracin Zn-Polymyx] Rash  . Polysporin [Bacitracin-Polymyxin B] Rash     Home Medications Current Facility-Administered Medications  Medication Dose Route Frequency Provider Last Rate Last Dose  . aspirin chewable tablet 324 mg  324 mg Oral Once Trixie Dredge, PA-C       Current Outpatient Prescriptions  Medication Sig Dispense Refill  . ALPRAZolam  (XANAX) 0.25 MG tablet Take 0.25 mg by mouth 2 (two) times daily as needed for anxiety.      . beta carotene w/minerals (OCUVITE) tablet Take 1 tablet by mouth daily.      . Cranberry-Vitamin C-Probiotic (AZO CRANBERRY) 250-30 MG TABS Take 2 tablets by mouth daily.      Marland Kitchen guaiFENesin (MUCINEX) 600 MG 12 hr tablet Take 600 mg by mouth 2 (two) times daily as needed for cough or to loosen phlegm.      . hyoscyamine (ANASPAZ) 0.125 MG TBDP disintergrating tablet Place 0.125 mg under the tongue every 6 (six) hours as needed for bladder spasms or cramping.      . insulin NPH-regular Human (NOVOLIN 70/30) (70-30) 100 UNIT/ML injection Inject 50-70 Units into the skin See admin instructions. 70 units in the morning and 50 units in the evening      . Polyethyl Glycol-Propyl Glycol (SYSTANE) 0.4-0.3 % SOLN Place 1 drop into both eyes daily as needed (titching/dry eye).      Marland Kitchen PRESCRIPTION MEDICATION Vaginal cream/wash prescribed from  Urologist Patient did not know the name of it and family has not been able to find it at any pharmacies.         FM history- M had an MI age 45   History   Social History  . Marital Status: Legally Separated    Spouse  Name: N/A    Number of Children: N/A  . Years of Education: N/A   Occupational History  . Not on file.   Social History Main Topics  . Smoking status: Never Smoker   . Smokeless tobacco: Never Used  . Alcohol Use: No  . Drug Use: No  . Sexual Activity: Not on file   Other Topics Concern  . Not on file   Social History Narrative  . No narrative on file     Review of Systems: General: negative for chills, fever, night sweats or weight changes.  Cardiovascular: negative for chest pain, dyspnea on exertion, edema, orthopnea, palpitations, paroxysmal nocturnal dyspnea or shortness of breath Dermatological: negative for rash Respiratory: negative for cough or wheezing Urologic: negative for hematuria Abdominal: negative for nausea, vomiting,  diarrhea, bright red blood per rectum, melena, or hematemesis Neurologic: negative for visual changes, syncope, or dizziness All other systems reviewed and are otherwise negative except as noted above.  Physical Exam: Blood pressure 143/72, pulse 87, temperature 97.9 F (36.6 C), temperature source Oral, resp. rate 16, SpO2 96.00%.  General appearance: alert, cooperative, no distress and morbidly obese Neck: no carotid bruit and no JVD Lungs: clear to auscultation bilaterally Heart: regular rate and rhythm Abdomen: obese, non tender Extremities: no edema, dry skin Pulses: diminnished, no FA bruit noted Skin: pale, dry Neurologic: Grossly normal    Labs:   Results for orders placed during the hospital encounter of 10/12/13 (from the past 24 hour(s))  CBC WITH DIFFERENTIAL     Status: Abnormal   Collection Time    10/12/13 12:55 PM      Result Value Ref Range   WBC 10.2  4.0 - 10.5 K/uL   RBC 4.90  3.87 - 5.11 MIL/uL   Hemoglobin 15.1 (*) 12.0 - 15.0 g/dL   HCT 16.142.1  09.636.0 - 04.546.0 %   MCV 85.9  78.0 - 100.0 fL   MCH 30.8  26.0 - 34.0 pg   MCHC 35.9  30.0 - 36.0 g/dL   RDW 40.913.9  81.111.5 - 91.415.5 %   Platelets 178  150 - 400 K/uL   Neutrophils Relative % 73  43 - 77 %   Neutro Abs 7.4  1.7 - 7.7 K/uL   Lymphocytes Relative 21  12 - 46 %   Lymphs Abs 2.1  0.7 - 4.0 K/uL   Monocytes Relative 5  3 - 12 %   Monocytes Absolute 0.5  0.1 - 1.0 K/uL   Eosinophils Relative 1  0 - 5 %   Eosinophils Absolute 0.1  0.0 - 0.7 K/uL   Basophils Relative 0  0 - 1 %   Basophils Absolute 0.0  0.0 - 0.1 K/uL  COMPREHENSIVE METABOLIC PANEL     Status: Abnormal   Collection Time    10/12/13 12:55 PM      Result Value Ref Range   Sodium 137  137 - 147 mEq/L   Potassium 4.4  3.7 - 5.3 mEq/L   Chloride 99  96 - 112 mEq/L   CO2 23  19 - 32 mEq/L   Glucose, Bld 216 (*) 70 - 99 mg/dL   BUN 11  6 - 23 mg/dL   Creatinine, Ser 7.820.67  0.50 - 1.10 mg/dL   Calcium 9.1  8.4 - 95.610.5 mg/dL   Total Protein 7.3   6.0 - 8.3 g/dL   Albumin 3.4 (*) 3.5 - 5.2 g/dL   AST 19  0 - 37 U/L  ALT 16  0 - 35 U/L   Alkaline Phosphatase 64  39 - 117 U/L   Total Bilirubin 0.6  0.3 - 1.2 mg/dL   GFR calc non Af Amer 83 (*) >90 mL/min   GFR calc Af Amer >90  >90 mL/min  TROPONIN I     Status: Abnormal   Collection Time    10/12/13 12:55 PM      Result Value Ref Range   Troponin I 0.49 (*) <0.30 ng/mL  URINALYSIS, ROUTINE W REFLEX MICROSCOPIC     Status: Abnormal   Collection Time    10/12/13  1:38 PM      Result Value Ref Range   Color, Urine YELLOW  YELLOW   APPearance CLEAR  CLEAR   Specific Gravity, Urine 1.012  1.005 - 1.030   pH 5.5  5.0 - 8.0   Glucose, UA NEGATIVE  NEGATIVE mg/dL   Hgb urine dipstick NEGATIVE  NEGATIVE   Bilirubin Urine NEGATIVE  NEGATIVE   Ketones, ur NEGATIVE  NEGATIVE mg/dL   Protein, ur NEGATIVE  NEGATIVE mg/dL   Urobilinogen, UA 0.2  0.0 - 1.0 mg/dL   Nitrite NEGATIVE  NEGATIVE   Leukocytes, UA TRACE (*) NEGATIVE  URINE MICROSCOPIC-ADD ON     Status: None   Collection Time    10/12/13  1:38 PM      Result Value Ref Range   Squamous Epithelial / LPF RARE  RARE   WBC, UA 0-2  <3 WBC/hpf   Bacteria, UA RARE  RARE     Radiology/Studies: Dg Chest 2 View  10/12/2013   CLINICAL DATA:  Heart palpitations.  EXAM: CHEST  2 VIEW  COMPARISON:  CT chest 07/29/2010.  FINDINGS: Lungs are clear. Heart size is normal. No pneumothorax or pleural effusion. Marked degenerative change about the shoulders is noted. There is also thoracic spondylosis.  IMPRESSION: No acute disease.   Electronically Signed   By: Drusilla Kanner M.D.   On: 10/12/2013 13:24    EKG:NSR TWI 1 AVL, inferior Qs- similar to 2011 EKG but TWI lead 1 and Q in AVF new.  ASSESSMENT AND PLAN:  Active Problems:   Elevated troponin   Abnormal finding on EKG   CAD, NATIVE VESSEL- mild CAD at cath 2002 and coronary CT 07/11   Diabetes mellitus type 2 in obese   OBESITY-MORBID   HYPERTENSION, BENIGN   LUNG NODULE-  evaluated 2012   Palpitations   PLAN: Admit, cycle Troponin, NPO for Myoview but will switch to cath if Troponin climb.    Deland Pretty, PA-C 10/12/2013, 3:13 PM  History and all data above reviewed.  Patient examined.  I agree with the findings as above.  The patient had symptoms that were quite atypical as described above.  However, she could have had a brief run of some tachyarrhythmia.  She did not have chest pain.  However, there appears to be some transient ST elevation in the inferior leads seen only on the rhythm strip from EMS.  EMS twelve lead and  ED 12 lead does not show any acute ST T wave changes.  However, her initial enzyme was negative.  The patient exam reveals COR:RRR, no rub  ,  Lungs: Clear  ,  Abd: Positive bowel sounds, no rebound no guarding, Ext No edema  .  All available labs, radiology testing, previous records reviewed. Agree with documented assessment and plan. Elevated troponin:  I don't know if this is spurious.  We will cycle  enzymes.  If her enzymes continue to rise she will need a cardiac cath.  Otherwise, she is set up for a Lexiscan Myoview in the AM.   Torsten Weniger  3:21 PM  10/12/2013

## 2013-10-12 NOTE — ED Provider Notes (Signed)
CSN: [id number]     Arrival date & time 10/12/13  1152 History   First MD Initiated Contact with Patient 10/12/13 1205     Chief Complaint  Patient presents with  . Palpitations     (Consider location/radiation/quality/duration/timing/severity/associated sxs/prior Treatment) HPI  Pt reports she got up to use the bathroom around 3am and felt lightheaded and dizzy, felt for her pulse and states she could not feel a pulse in her neck, felt that "it was rolling around on the floor and there was no rhythm."  States she usually has an irregular heart rate but that this was different - usually has 3 beats, then 6, then 3, then 1, etc - has been evaluated for this previously- this is the first time she could not palpate her own pulse.  These symptoms lasted approximately two hours and resolved.  She took a xanax with relief. She went back to sleep and woke up feeling well.  Also notes new heartburn, described as "feeling like I have to burp."  Attempted to see PCP today and was instructed to come to ED. Denies any chest pain, shortness of breath, back or abdominal pain, no nausea.  Has had mild cough only when exposed to tree pollen.  Has taken some mucinex, but no other medication changes or additions recently.   Past Medical History  Diagnosis Date  . Irregular heart rhythm   . Diabetes mellitus without complication   . Arthritis   . Macular degeneration   . Anxiety    Past Surgical History  Procedure Laterality Date  . Back surgery    . Abdominal hysterectomy    . Cholecystectomy     History reviewed. No pertinent family history. History  Substance Use Topics  . Smoking status: Never Smoker   . Smokeless tobacco: Never Used  . Alcohol Use: No   OB History   Grav Para Term Preterm Abortions TAB SAB Ect Mult Living                 Review of Systems  Constitutional: Negative for fever.  Respiratory: Positive for cough. Negative for shortness of breath.   Cardiovascular: Negative for  chest pain and leg swelling.  Gastrointestinal: Negative for nausea, vomiting, abdominal pain and diarrhea.  Neurological: Positive for dizziness and light-headedness.  All other systems reviewed and are negative.      Allergies  Erythromycin; Penicillins; Sulfonamide derivatives; Latex; Neosporin; and Polysporin  Home Medications   Current Outpatient Rx  Name  Route  Sig  Dispense  Refill  . ALPRAZolam (XANAX) 0.25 MG tablet   Oral   Take 0.25 mg by mouth 2 (two) times daily as needed for anxiety.         . beta carotene w/minerals (OCUVITE) tablet   Oral   Take 1 tablet by mouth daily.         . Cranberry-Vitamin C-Probiotic (AZO CRANBERRY) 250-30 MG TABS   Oral   Take 2 tablets by mouth daily.         Marland Kitchen. guaiFENesin (MUCINEX) 600 MG 12 hr tablet   Oral   Take 600 mg by mouth 2 (two) times daily as needed for cough or to loosen phlegm.         . hyoscyamine (ANASPAZ) 0.125 MG TBDP disintergrating tablet   Sublingual   Place 0.125 mg under the tongue every 6 (six) hours as needed for bladder spasms or cramping.         . insulin NPH-regular  Human (NOVOLIN 70/30) (70-30) 100 UNIT/ML injection   Subcutaneous   Inject 50-70 Units into the skin See admin instructions. 70 units in the morning and 50 units in the evening         . Polyethyl Glycol-Propyl Glycol (SYSTANE) 0.4-0.3 % SOLN   Both Eyes   Place 1 drop into both eyes daily as needed (titching/dry eye).         Marland Kitchen PRESCRIPTION MEDICATION      Vaginal cream/wash prescribed from  Urologist Patient did not know the name of it and family has not been able to find it at any pharmacies.          BP 139/73  Pulse 94  Temp(Src) 97.9 F (36.6 C) (Oral)  Resp 15  SpO2 97% Physical Exam  Nursing note and vitals reviewed. Constitutional: She appears well-developed and well-nourished. No distress.  HENT:  Head: Normocephalic and atraumatic.  Neck: Neck supple.  Cardiovascular: Normal rate and regular  rhythm.   Pulmonary/Chest: Effort normal and breath sounds normal. No respiratory distress. She has no wheezes. She has no rales.  Abdominal: Soft. She exhibits no distension. There is no tenderness. There is no rebound and no guarding.  Morbidly obese  Musculoskeletal: She exhibits no edema.  Neurological: She is alert.  Skin: She is not diaphoretic.    ED Course  Procedures (including critical care time) Labs Review Labs Reviewed  CBC WITH DIFFERENTIAL - Abnormal; Notable for the following:    Hemoglobin 15.1 (*)    All other components within normal limits  COMPREHENSIVE METABOLIC PANEL - Abnormal; Notable for the following:    Glucose, Bld 216 (*)    Albumin 3.4 (*)    GFR calc non Af Amer 83 (*)    All other components within normal limits  TROPONIN I - Abnormal; Notable for the following:    Troponin I 0.49 (*)    All other components within normal limits  URINALYSIS, ROUTINE W REFLEX MICROSCOPIC - Abnormal; Notable for the following:    Leukocytes, UA TRACE (*)    All other components within normal limits  URINE MICROSCOPIC-ADD ON   Imaging Review Dg Chest 2 View  10/12/2013   CLINICAL DATA:  Heart palpitations.  EXAM: CHEST  2 VIEW  COMPARISON:  CT chest 07/29/2010.  FINDINGS: Lungs are clear. Heart size is normal. No pneumothorax or pleural effusion. Marked degenerative change about the shoulders is noted. There is also thoracic spondylosis.  IMPRESSION: No acute disease.   Electronically Signed   By: Drusilla Kanner M.D.   On: 10/12/2013 13:24     EKG Interpretation   Date/Time:  Friday October 12 2013 11:58:44 EDT Ventricular Rate:  97 PR Interval:  161 QRS Duration: 94 QT Interval:  346 QTC Calculation: 439 R Axis:   12 Text Interpretation:  Sinus rhythm Nonspecific T abnormalities, lateral  leads Since last tracing T wave abnormality is new Confirmed by Effie Shy  MD,  ELLIOTT [phone]) on 10/12/2013 1:54:15 PM      12:51 PM Discussed pt with Dr Effie Shy who will  also see patient.   1:55 PM Troponin is elevated.  Have ordered aspirin.  Dr Effie Shy to see patient.  Have ordered cardiology consult.   2:03 PM Spoke with Trish.  Cardiology to see patient.   MDM   Final diagnoses:  Palpitations  Lightheadedness  Elevated troponin    Pt with lightheadedness/dizziness, palpitations this morning. Found to have elevated troponin.  Admitted to cardiology.  Trixie Dredge, PA-C 10/12/13 1525

## 2013-10-12 NOTE — Progress Notes (Addendum)
ANTICOAGULATION CONSULT NOTE - Initial Consult  Pharmacy Consult for heparin Indication: chest pain/ACS  Allergies  Allergen Reactions  . Erythromycin     Heart flutter  . Penicillins     unknown  . Sulfonamide Derivatives     diarrhea  . Latex Rash  . Neosporin [Neomycin-Bacitracin Zn-Polymyx] Rash  . Polysporin [Bacitracin-Polymyxin B] Rash    Patient Measurements: Height: 5\' 6"  (167.6 cm) Weight: 240 lb (108.863 kg) IBW/kg (Calculated) : 59.3 Heparin Dosing Weight: 84.5 kg  Vital Signs: Temp: 97.9 F (36.6 C) (04/03 1208) Temp src: Oral (04/03 1208) BP: 143/72 mmHg (04/03 1430) Pulse Rate: 87 (04/03 1430)  Labs:  Recent Labs  10/12/13 1255  HGB 15.1*  HCT 42.1  PLT 178  CREATININE 0.67  TROPONINI 0.49*    Estimated Creatinine Clearance: 74.7 ml/min (by C-G formula based on Cr of 0.67).   Medical History: Past Medical History  Diagnosis Date  . Irregular heart rhythm   . Diabetes mellitus without complication   . Arthritis   . Macular degeneration   . Anxiety     Medications:  See PTA medication list  Assessment: 79 y/o female who presents to the ED with palpitations found to have an elevated troponin. Pharmacy consulted to begin IV heparin. Plan is for myoview in the morning unless her enzymes continue to rise for which she would need a cath. No bleeding noted, CBC is normal. Spoke with patient and answered all her questions.  Goal of Therapy:  Heparin level 0.3-0.7 units/ml Monitor platelets by anticoagulation protocol: Yes   Plan:  - Heparin 4000 units IV bolus then 1100 units/hr - 6 hr heparin level - Daily heparin level and CBC - Monitor for s/sx of bleeding  [facility] SummitJennifer Haworth, 17[address] Rainbow BoulevardPharm.D., BCPS Clinical Pharmacist Pager: [phone] 10/12/2013 3:44 PM

## 2013-10-12 NOTE — Progress Notes (Signed)
Notified Rhonda Barrett of troponin of 0.73. She stated no plans to cath at this point. Pt denies chest pain. Ok to feed pt per [place]. Will cont to monitor

## 2013-10-12 NOTE — ED Notes (Signed)
Pt from home via [facility] with c/o palpitations starting at 4 am.  Pt took alprazolam at both 4 am and at 830 am.  Pt denies pain or any kind of feeling currently.  Given 324 mg aspirin.  Hx of irregular HR, not a-fib.  Pt in NAD A&O.

## 2013-10-12 NOTE — ED Notes (Signed)
Attempted report 

## 2013-10-12 NOTE — ED Provider Notes (Signed)
  Face-to-face evaluation   History: When she awoke this morning at 3:00 to urinate, she had a sensation of irregular heart beating. She denies chest pain, fever, chills, cough, nausea, or vomiting  Physical exam: Elderly patient, alert, calm, cooperative. Heart regular rate and rhythm. No murmur. Lungs clear to auscultation.  Medical screening examination/treatment/procedure(s) were conducted as a shared visit with non-physician practitioner(s) and myself.  I personally evaluated the patient during the encounter  Flint MelterElliott L Javarius Tsosie, MD 10/12/13 [phone]

## 2013-10-13 ENCOUNTER — Inpatient Hospital Stay (HOSPITAL_COMMUNITY): Payer: Medicare Other

## 2013-10-13 DIAGNOSIS — R079 Chest pain, unspecified: Secondary | ICD-10-CM

## 2013-10-13 DIAGNOSIS — R799 Abnormal finding of blood chemistry, unspecified: Secondary | ICD-10-CM

## 2013-10-13 LAB — CBC
HCT: 36.1 % (ref 36.0–46.0)
Hemoglobin: 12.7 g/dL (ref 12.0–15.0)
MCH: 30.7 pg (ref 26.0–34.0)
MCHC: 35.2 g/dL (ref 30.0–36.0)
MCV: 87.2 fL (ref 78.0–100.0)
Platelets: 169 10*3/uL (ref 150–400)
RBC: 4.14 MIL/uL (ref 3.87–5.11)
RDW: 14.2 % (ref 11.5–15.5)
WBC: 8.9 10*3/uL (ref 4.0–10.5)

## 2013-10-13 LAB — LIPID PANEL
Cholesterol: 155 mg/dL (ref 0–200)
HDL: 29 mg/dL — ABNORMAL LOW (ref >39)
LDL Cholesterol: 87 mg/dL (ref 0–99)
Total CHOL/HDL Ratio: 5.3 RATIO
Triglycerides: 194 mg/dL — ABNORMAL HIGH (ref <150)
VLDL: 39 mg/dL (ref 0–40)

## 2013-10-13 LAB — BASIC METABOLIC PANEL
BUN: 11 mg/dL (ref 6–23)
CO2: 25 mEq/L (ref 19–32)
Calcium: 8.7 mg/dL (ref 8.4–10.5)
Chloride: 101 mEq/L (ref 96–112)
Creatinine, Ser: 0.74 mg/dL (ref 0.50–1.10)
GFR calc Af Amer: >90 mL/min (ref >90)
GFR calc non Af Amer: 81 mL/min — ABNORMAL LOW (ref >90)
Glucose, Bld: 166 mg/dL — ABNORMAL HIGH (ref 70–99)
Potassium: 4 mEq/L (ref 3.7–5.3)
Sodium: 141 mEq/L (ref 137–147)

## 2013-10-13 LAB — GLUCOSE, CAPILLARY
GLUCOSE-CAPILLARY: 173 mg/dL — AB (ref 70–99)
GLUCOSE-CAPILLARY: 138 mg/dL — AB (ref 70–99)
Glucose-Capillary: 153 mg/dL — ABNORMAL HIGH (ref 70–99)
Glucose-Capillary: 167 mg/dL — ABNORMAL HIGH (ref 70–99)

## 2013-10-13 LAB — HEPARIN LEVEL (UNFRACTIONATED)
HEPARIN UNFRACTIONATED: 0.24 [IU]/mL — AB (ref 0.30–0.70)
HEPARIN UNFRACTIONATED: 0.1 [IU]/mL — AB (ref 0.30–0.70)
Heparin Unfractionated: 0.24 IU/mL — ABNORMAL LOW (ref 0.30–0.70)

## 2013-10-13 LAB — TROPONIN I: Troponin I: 0.42 ng/mL (ref <0.30)

## 2013-10-13 MED ORDER — TECHNETIUM TC 99M SESTAMIBI - CARDIOLITE: 30.0000 | Freq: Once | INTRAVENOUS | Status: DC | PRN

## 2013-10-13 MED ORDER — HEPARIN BOLUS VIA INFUSION
2000.0000 [IU] | Freq: Once | INTRAVENOUS | Status: AC
  Administered 2013-10-13: 2000 [IU] via INTRAVENOUS
  Filled 2013-10-13: qty 2000

## 2013-10-13 MED ORDER — TECHNETIUM TC 99M SESTAMIBI GENERIC - CARDIOLITE
10.0000 | Freq: Once | INTRAVENOUS | Status: AC | PRN
  Administered 2013-10-13: 10 via INTRAVENOUS

## 2013-10-13 MED ORDER — HEPARIN (PORCINE) IN NACL 100-0.45 UNIT/ML-% IJ SOLN
1800.0000 [IU]/h | INTRAMUSCULAR | Status: DC
  Administered 2013-10-14 (×2): 1800 [IU]/h via INTRAVENOUS
  Filled 2013-10-13 (×5): qty 250

## 2013-10-13 NOTE — Progress Notes (Signed)
ANTICOAGULATION CONSULT NOTE - Follow Up Consult  Pharmacy Consult for Heparin Indication: chest pain/ACS  Allergies  Allergen Reactions  . Erythromycin     Heart flutter  . Penicillins     unknown  . Sulfonamide Derivatives     diarrhea  . Latex Rash  . Neosporin [Neomycin-Bacitracin Zn-Polymyx] Rash  . Polysporin [Bacitracin-Polymyxin B] Rash    Patient Measurements: Height: 5\' 6"  (167.6 cm) Weight: 240 lb (108.863 kg) IBW/kg (Calculated) : 59.3 Heparin Dosing Weight: 84kg  Vital Signs: Temp: 97.7 F (36.5 C) (04/04 0607) Temp src: Oral (04/04 0607) BP: 146/60 mmHg (04/04 0607) Pulse Rate: 78 (04/04 0607)  Labs:  Recent Labs  10/12/13 1255 10/12/13 1548 10/12/13 2359 10/13/13 0337 10/13/13 0338 10/13/13 1111  HGB 15.1*  --   --   --  12.7  --   HCT 42.1  --   --   --  36.1  --   PLT 178  --   --   --  169  --   LABPROT  --  12.4  --   --   --   --   INR  --  0.94  --   --   --   --   HEPARINUNFRC  --   --  0.10*  --   --  0.24*  CREATININE 0.67  --   --   --  0.74  --   TROPONINI 0.49* 0.73*  --  0.42*  --   --     Estimated Creatinine Clearance: 74.7 ml/min (by C-G formula based on Cr of 0.74).   Medications:  Heparin @ 1300 units/hr  Assessment: 78yof continues on heparin for chest pain with positive troponins. Original plan was for a myoview, however, with worsening EKG changes she will need a cath. Plan is for cath tomorrow. Heparin level is slightly below goal despite rebolus and rate increase this morning. CBC is stable. No bleeding reported.  Goal of Therapy:  Heparin level 0.3-0.7 units/ml Monitor platelets by anticoagulation protocol: Yes   Plan:  1) Increase heparin to 1500 units/hr 2) Check 8 hour heparin level   Fredrik RiggerMarkle, Kaylon Laroche Sue 10/13/2013,11:59 AM

## 2013-10-13 NOTE — Progress Notes (Signed)
    Subjective:  Denies CP or dyspnea   Objective:  Filed Vitals:   10/12/13 1530 10/12/13 1631 10/12/13 2056 10/13/13 0607  BP: 114/59 109/80 127/58 146/60  Pulse: 89 96 82 78  Temp:  97.7 F (36.5 C) 97.6 F (36.4 C) 97.7 F (36.5 C)  TempSrc:  Oral Oral Oral  Resp: 20 19 16 16   Height:      Weight:      SpO2: 95% 98% 97% 97%    Intake/Output from previous day:  Intake/Output Summary (Last 24 hours) at 10/13/13 [id number] Last data filed at 10/12/13 2100  Gross per 24 hour  Intake      0 ml  Output    400 ml  Net   -400 ml    Physical Exam: Physical exam: Well-developed obese in no acute distress.  Skin is warm and dry.  HEENT is normal.  Neck is supple.  Chest is clear to auscultation with normal expansion.  Cardiovascular exam is regular rate and rhythm.  Abdominal exam nontender or distended. No masses palpated. Extremities show no edema. neuro grossly intact    Lab Results: Basic Metabolic Panel:  Recent Labs  46/96/2902/09/24 1255 10/13/13 0338  NA 137 141  K 4.4 4.0  CL 99 101  CO2 23 25  GLUCOSE 216* 166*  BUN 11 11  CREATININE 0.67 0.74  CALCIUM 9.1 8.7   CBC:  Recent Labs  10/12/13 1255 10/13/13 0338  WBC 10.2 8.9  NEUTROABS 7.4  --   HGB 15.1* 12.7  HCT 42.1 36.1  MCV 85.9 87.2  PLT 178 169   Cardiac Enzymes:  Recent Labs  10/12/13 1255 10/12/13 1548 10/13/13 0337  TROPONINI 0.49* 0.73* 0.42*     Assessment/Plan:  1 non-ST elevation myocardial infarction-the patient had chest pressure yesterday that she attributed to reflux. Her troponin is minimally elevated. Her electrocardiogram shows worsening lateral T-wave inversion. I think definitive evaluation is warranted. Plan to cancel nuclear study today. Proceed with cardiac catheterization tomorrow morning. The risks and benefits were discussed and the patient agrees to proceed. Continue aspirin, heparin, statin and beta blocker. 2 diabetes mellitus-continue insulin and follow  CBG. 3 hypertension-continue present medications.  Olga MillersBrian Crenshaw 10/13/2013, 8:08 AM

## 2013-10-13 NOTE — Progress Notes (Signed)
ANTICOAGULATION CONSULT NOTE - Follow Up Consult  Pharmacy Consult for Heparin Indication: chest pain/ACS  Allergies  Allergen Reactions  . Erythromycin     Heart flutter  . Penicillins     unknown  . Sulfonamide Derivatives     diarrhea  . Latex Rash  . Neosporin [Neomycin-Bacitracin Zn-Polymyx] Rash  . Polysporin [Bacitracin-Polymyxin B] Rash    Patient Measurements: Height: 5\' 6"  (167.6 cm) Weight: 240 lb (108.863 kg) IBW/kg (Calculated) : 59.3 Heparin Dosing Weight: 84kg  Vital Signs: Temp: 97.8 F (36.6 C) (04/04 2000) Temp src: Oral (04/04 1450) BP: 147/62 mmHg (04/04 2000) Pulse Rate: 80 (04/04 2000)  Labs:  Recent Labs  10/12/13 1255 10/12/13 1548 10/12/13 2359 10/13/13 0337 10/13/13 0338 10/13/13 1111 10/13/13 2035  HGB 15.1*  --   --   --  12.7  --   --   HCT 42.1  --   --   --  36.1  --   --   PLT 178  --   --   --  169  --   --   LABPROT  --  12.4  --   --   --   --   --   INR  --  0.94  --   --   --   --   --   HEPARINUNFRC  --   --  0.10*  --   --  0.24* 0.24*  CREATININE 0.67  --   --   --  0.74  --   --   TROPONINI 0.49* 0.73*  --  0.42*  --   --   --     Estimated Creatinine Clearance: 74.7 ml/min (by C-G formula based on Cr of 0.74).   Medications:  Heparin @ 1500 units/hr  Assessment: 78yof continues on heparin for chest pain with positive troponins. Original plan was for a myoview, however, with worsening EKG changes she will need a cath. Heparin level is slightly below goal despite rebolus and rate increase this morning. CBC is stable. No bleeding reported.  Goal of Therapy:  Heparin level 0.3-0.7 units/ml Monitor platelets by anticoagulation protocol: Yes   Plan:  1) Increase heparin to 1800 units/hr 2) Check 8 hour heparin level   Mickeal SkinnerFrens, Dakotah Heiman John 10/13/2013,9:18 PM

## 2013-10-13 NOTE — Progress Notes (Signed)
ANTICOAGULATION CONSULT NOTE - Follow Up Consult  Pharmacy Consult for Heparin  Indication: chest pain/ACS  Allergies  Allergen Reactions  . Erythromycin     Heart flutter  . Penicillins     unknown  . Sulfonamide Derivatives     diarrhea  . Latex Rash  . Neosporin [Neomycin-Bacitracin Zn-Polymyx] Rash  . Polysporin [Bacitracin-Polymyxin B] Rash    Patient Measurements: Height: 5\' 6"  (167.6 cm) Weight: 240 lb (108.863 kg) IBW/kg (Calculated) : 59.3 Heparin Dosing Weight: ~84 kg  Vital Signs: Temp: 97.6 F (36.4 C) (04/03 2056) Temp src: Oral (04/03 2056) BP: 127/58 mmHg (04/03 2056) Pulse Rate: 82 (04/03 2056)  Labs:  Recent Labs  10/12/13 1255 10/12/13 1548 10/12/13 2359  HGB 15.1*  --   --   HCT 42.1  --   --   PLT 178  --   --   LABPROT  --  12.4  --   INR  --  0.94  --   HEPARINUNFRC  --   --  0.10*  CREATININE 0.67  --   --   TROPONINI 0.49* 0.73*  --     Estimated Creatinine Clearance: 74.7 ml/min (by C-G formula based on Cr of 0.67).   Medications:  Heparin 1100 units/hr  Assessment: 79 y/o F on heparin for +troponin. Plan for myoview in the AM. HL is 0.1. Other labs as above. No issues per RN.   Goal of Therapy:  Heparin level 0.3-0.7 units/ml Monitor platelets by anticoagulation protocol: Yes   Plan:  -Heparin 2000 units BOLUS x 1 -Increase heparin drip to 1300 units/hr -1000 HL -Daily CBC/HL -Monitor for bleeding  Abran DukeLedford, Jeremih Dearmas 10/13/2013,1:44 AM

## 2013-10-14 ENCOUNTER — Other Ambulatory Visit: Payer: Self-pay

## 2013-10-14 LAB — GLUCOSE, CAPILLARY
GLUCOSE-CAPILLARY: 180 mg/dL — AB (ref 70–99)
GLUCOSE-CAPILLARY: 135 mg/dL — AB (ref 70–99)
GLUCOSE-CAPILLARY: 164 mg/dL — AB (ref 70–99)
GLUCOSE-CAPILLARY: 135 mg/dL — AB (ref 70–99)

## 2013-10-14 LAB — CBC
HCT: 38.6 % (ref 36.0–46.0)
HEMOGLOBIN: 13.6 g/dL (ref 12.0–15.0)
MCH: 30.6 pg (ref 26.0–34.0)
MCHC: 35.2 g/dL (ref 30.0–36.0)
MCV: 86.9 fL (ref 78.0–100.0)
Platelets: 179 10*3/uL (ref 150–400)
RBC: 4.44 MIL/uL (ref 3.87–5.11)
RDW: 13.9 % (ref 11.5–15.5)
WBC: 9.4 10*3/uL (ref 4.0–10.5)

## 2013-10-14 LAB — HEPARIN LEVEL (UNFRACTIONATED): HEPARIN UNFRACTIONATED: 0.36 [IU]/mL (ref 0.30–0.70)

## 2013-10-14 MED ORDER — SODIUM CHLORIDE 0.9 % IJ SOLN: 3.0000 mL | INTRAMUSCULAR | Status: DC | PRN

## 2013-10-14 MED ORDER — SODIUM CHLORIDE 0.9 % IJ SOLN: 3.0000 mL | Freq: Two times a day (BID) | INTRAMUSCULAR | Status: DC

## 2013-10-14 MED ORDER — ASPIRIN 81 MG PO CHEW
81.0000 mg | CHEWABLE_TABLET | ORAL | Status: AC
  Administered 2013-10-15: 81 mg via ORAL
  Filled 2013-10-14: qty 1

## 2013-10-14 MED ORDER — SODIUM CHLORIDE 0.9 % IV SOLN: 1.0000 mL/kg/h | INTRAVENOUS | Status: DC

## 2013-10-14 MED ORDER — SODIUM CHLORIDE 0.9 % IV SOLN
250.0000 mL | INTRAVENOUS | Status: DC | PRN
  Administered 2013-10-14: 1000 mL via INTRAVENOUS

## 2013-10-14 NOTE — Progress Notes (Signed)
    Subjective:  No chest pain or SOB  Objective:  Vital Signs in the last 24 hours: Temp:  [97.8 F (36.6 C)-97.9 F (36.6 C)] 97.8 F (36.6 C) (04/05 0500) Pulse Rate:  [73-80] 77 (04/05 0500) Resp:  [16-18] 16 (04/05 0500) BP: (144-155)/(56-62) 155/56 mmHg (04/05 0500) SpO2:  [95 %-99 %] 95 % (04/05 0500) Weight:  [238 lb (107.956 kg)] 238 lb (107.956 kg) (04/05 0500)  Intake/Output from previous day: No intake or output data in the 24 hours ending 10/14/13 0803  Physical Exam: General appearance: alert, cooperative, no distress and moderately obese Lungs: clear to auscultation bilaterally Heart: regular rate and rhythm   Rate: 77  Rhythm: normal sinus rhythm  Lab Results:  Recent Labs  10/13/13 0338 10/14/13 0415  WBC 8.9 9.4  HGB 12.7 13.6  PLT 169 179    Recent Labs  10/12/13 1255 10/13/13 0338  NA 137 141  K 4.4 4.0  CL 99 101  CO2 23 25  GLUCOSE 216* 166*  BUN 11 11  CREATININE 0.67 0.74    Recent Labs  10/12/13 1548 10/13/13 0337  TROPONINI 0.73* 0.42*    Recent Labs  10/12/13 1548  INR 0.94    Imaging: Imaging results have been reviewed  Cardiac Studies:  Assessment/Plan:   Active Problems:   Elevated troponin   Abnormal finding on EKG   CAD, NATIVE VESSEL- mild CAD at cath 2002 and coronary CT 07/11   Diabetes mellitus type 2 in obese   OBESITY-MORBID   HYPERTENSION, BENIGN   LUNG NODULE- evaluated 2012   Palpitations    PLAN: Cath in am.   Corine ShelterLuke Kilroy PA-C Beeper [phone] 10/14/2013, 8:03 AM   See note earlier in Day Olga MillersBrian Crenshaw

## 2013-10-14 NOTE — Progress Notes (Signed)
ANTICOAGULATION CONSULT NOTE - Follow Up Consult  Pharmacy Consult for Heparin  Indication: chest pain/ACS  Allergies  Allergen Reactions  . Erythromycin     Heart flutter  . Penicillins     unknown  . Sulfonamide Derivatives     diarrhea  . Latex Rash  . Neosporin [Neomycin-Bacitracin Zn-Polymyx] Rash  . Polysporin [Bacitracin-Polymyxin B] Rash    Patient Measurements: Height: 5\' 6"  (167.6 cm) Weight: 240 lb (108.863 kg) IBW/kg (Calculated) : 59.3 Heparin Dosing Weight: ~84 kg  Vital Signs: Temp: 97.8 F (36.6 C) (04/04 2000) BP: 147/62 mmHg (04/04 2000) Pulse Rate: 80 (04/04 2000)  Labs:  Recent Labs  10/12/13 1255 10/12/13 1548  10/13/13 0337 10/13/13 0338 10/13/13 1111 10/13/13 2035 10/14/13 0415  HGB 15.1*  --   --   --  12.7  --   --  13.6  HCT 42.1  --   --   --  36.1  --   --  38.6  PLT 178  --   --   --  169  --   --  179  LABPROT  --  12.4  --   --   --   --   --   --   INR  --  0.94  --   --   --   --   --   --   HEPARINUNFRC  --   --   < >  --   --  0.24* 0.24* 0.36  CREATININE 0.67  --   --   --  0.74  --   --   --   TROPONINI 0.49* 0.73*  --  0.42*  --   --   --   --   < > = values in this interval not displayed.  Estimated Creatinine Clearance: 74.7 ml/min (by C-G formula based on Cr of 0.74).   Medications:  Heparin 1800 units/hr  Assessment: 79 y/o F on heparin for +troponin. Plan for cath . HL is 0.36. Other labs as above. No issues per RN.   Goal of Therapy:  Heparin level 0.3-0.7 units/ml Monitor platelets by anticoagulation protocol: Yes   Plan:  -Continue heparin at 1800 units/hr -1400 HL, pending cath -Daily CBC/HL -Monitor for bleeding  Abran DukeLedford, Teiara Baria 10/14/2013,5:36 AM

## 2013-10-14 NOTE — Progress Notes (Signed)
    Subjective:  Denies CP or dyspnea   Objective:  Filed Vitals:   10/13/13 0607 10/13/13 1450 10/13/13 2000 10/14/13 0500  BP: 146/60 144/60 147/62 155/56  Pulse: 78 73 80 77  Temp: 97.7 F (36.5 C) 97.9 F (36.6 C) 97.8 F (36.6 C) 97.8 F (36.6 C)  TempSrc: Oral Oral    Resp: 16 17 18 16   Height:      Weight:    238 lb (107.956 kg)  SpO2: 97% 99% 98% 95%    Intake/Output from previous day: No intake or output data in the 24 hours ending 10/14/13 0801  Physical Exam: Physical exam: Well-developed obese in no acute distress.  Skin is warm and dry.  HEENT is normal.  Neck is supple.  Chest is clear to auscultation with normal expansion.  Cardiovascular exam is regular rate and rhythm.  Abdominal exam nontender or distended. No masses palpated. Extremities show no edema. neuro grossly intact    Lab Results: Basic Metabolic Panel:  Recent Labs  95/62/1302/09/24 1255 10/13/13 0338  NA 137 141  K 4.4 4.0  CL 99 101  CO2 23 25  GLUCOSE 216* 166*  BUN 11 11  CREATININE 0.67 0.74  CALCIUM 9.1 8.7   CBC:  Recent Labs  10/12/13 1255 10/13/13 0338 10/14/13 0415  WBC 10.2 8.9 9.4  NEUTROABS 7.4  --   --   HGB 15.1* 12.7 13.6  HCT 42.1 36.1 38.6  MCV 85.9 87.2 86.9  PLT 178 169 179   Cardiac Enzymes:  Recent Labs  10/12/13 1255 10/12/13 1548 10/13/13 0337  TROPONINI 0.49* 0.73* 0.42*     Assessment/Plan:  1 non-ST elevation myocardial infarction-the patient had chest pressure on the day of admission that she attributed to reflux. Her troponin is minimally elevated. Her electrocardiogram showed worsening lateral T-wave inversion. I think definitive evaluation is warranted. Plan to proceed with cardiac catheterization tomorrow morning. The risks and benefits were discussed and the patient agrees to proceed. Continue aspirin, heparin, statin and beta blocker. 2 diabetes mellitus-continue insulin and follow CBG. 3 hypertension-continue present  medications.  Lauren MillersBrian Charlese Thomas 10/14/2013, 8:01 AM

## 2013-10-15 ENCOUNTER — Encounter (HOSPITAL_COMMUNITY)
Admission: EM | Disposition: A | Discharge status: Home / Self Care | Payer: Medicare Other | Source: Home / Self Care | Attending: Cardiology

## 2013-10-15 DIAGNOSIS — I2510 Atherosclerotic heart disease of native coronary artery without angina pectoris: Secondary | ICD-10-CM

## 2013-10-15 HISTORY — PX: LEFT HEART CATHETERIZATION WITH CORONARY ANGIOGRAM: SHX5451

## 2013-10-15 LAB — CBC
HCT: 35.4 % — ABNORMAL LOW (ref 36.0–46.0)
Hemoglobin: 12.3 g/dL (ref 12.0–15.0)
MCH: 30.1 pg (ref 26.0–34.0)
MCHC: 34.7 g/dL (ref 30.0–36.0)
MCV: 86.6 fL (ref 78.0–100.0)
PLATELETS: 168 10*3/uL (ref 150–400)
RBC: 4.09 MIL/uL (ref 3.87–5.11)
RDW: 13.9 % (ref 11.5–15.5)
WBC: 7.9 10*3/uL (ref 4.0–10.5)

## 2013-10-15 LAB — BASIC METABOLIC PANEL
BUN: 10 mg/dL (ref 6–23)
CO2: 26 mEq/L (ref 19–32)
Calcium: 8.5 mg/dL (ref 8.4–10.5)
Chloride: 103 mEq/L (ref 96–112)
Creatinine, Ser: 0.72 mg/dL (ref 0.50–1.10)
GFR calc Af Amer: >90 mL/min (ref >90)
GFR calc non Af Amer: 81 mL/min — ABNORMAL LOW (ref >90)
Glucose, Bld: 160 mg/dL — ABNORMAL HIGH (ref 70–99)
Potassium: 3.8 mEq/L (ref 3.7–5.3)
Sodium: 140 mEq/L (ref 137–147)

## 2013-10-15 LAB — GLUCOSE, CAPILLARY
Glucose-Capillary: 132 mg/dL — ABNORMAL HIGH (ref 70–99)
Glucose-Capillary: 141 mg/dL — ABNORMAL HIGH (ref 70–99)

## 2013-10-15 LAB — HEPARIN LEVEL (UNFRACTIONATED): Heparin Unfractionated: 0.36 IU/mL (ref 0.30–0.70)

## 2013-10-15 SURGERY — LEFT HEART CATHETERIZATION WITH CORONARY ANGIOGRAM
Anesthesia: LOCAL

## 2013-10-15 MED ORDER — HEPARIN (PORCINE) IN NACL 2-0.9 UNIT/ML-% IJ SOLN
INTRAMUSCULAR | Status: AC
  Filled 2013-10-15: qty 1000

## 2013-10-15 MED ORDER — HYDRALAZINE HCL 20 MG/ML IJ SOLN
INTRAMUSCULAR | Status: AC
  Filled 2013-10-15: qty 1

## 2013-10-15 MED ORDER — VERAPAMIL HCL 2.5 MG/ML IV SOLN
INTRAVENOUS | Status: AC
  Filled 2013-10-15: qty 2

## 2013-10-15 MED ORDER — ASPIRIN 81 MG PO TBEC: 81.0000 mg | DELAYED_RELEASE_TABLET | Freq: Every day | ORAL | Status: DC

## 2013-10-15 MED ORDER — HEPARIN SODIUM (PORCINE) 1000 UNIT/ML IJ SOLN
INTRAMUSCULAR | Status: AC
  Filled 2013-10-15: qty 1

## 2013-10-15 MED ORDER — ACETAMINOPHEN 325 MG PO TABS: 650.0000 mg | ORAL_TABLET | ORAL | Status: DC | PRN

## 2013-10-15 MED ORDER — LIDOCAINE HCL (PF) 1 % IJ SOLN
INTRAMUSCULAR | Status: AC
  Filled 2013-10-15: qty 30

## 2013-10-15 MED ORDER — DIAZEPAM 5 MG PO TABS
5.0000 mg | ORAL_TABLET | ORAL | Status: AC
  Administered 2013-10-15: 5 mg via ORAL
  Filled 2013-10-15: qty 1

## 2013-10-15 MED ORDER — FENTANYL CITRATE 0.05 MG/ML IJ SOLN
INTRAMUSCULAR | Status: AC
Start: 2013-10-15 — End: 2013-10-15
  Filled 2013-10-15: qty 2

## 2013-10-15 MED ORDER — MIDAZOLAM HCL 2 MG/2ML IJ SOLN
INTRAMUSCULAR | Status: AC
  Filled 2013-10-15: qty 2

## 2013-10-15 NOTE — Interval H&P Note (Signed)
Cath Lab Visit (complete for each Cath Lab visit)  Clinical Evaluation Leading to the Procedure:   ACS: yes  Non-ACS:    Anginal Classification: CCS III  Anti-ischemic medical therapy: No Therapy  Non-Invasive Test Results: No non-invasive testing performed  Prior CABG: No previous CABG      History and Physical Interval Note:  10/15/2013 7:49 AM  Lauren Thomas  has presented today for surgery, with the diagnosis of cp  The various methods of treatment have been discussed with the patient and family. After consideration of risks, benefits and other options for treatment, the patient has consented to  Procedure(s): LEFT HEART CATHETERIZATION WITH CORONARY ANGIOGRAM (N/A) as a surgical intervention .  The patient's history has been reviewed, patient examined, no change in status, stable for surgery.  I have reviewed the patient's chart and labs.  Questions were answered to the patient's satisfaction.     Lesleigh NoeSMITH III,Ketrina Boateng W

## 2013-10-15 NOTE — Discharge Instructions (Signed)
Coronary Angiography °Coronary angiography is an X-ray procedure used to look at the arteries in the heart. In this procedure, a dye (contrast dye) is injected through a long, hollow tube (catheter). The catheter is about the size of a piece of cooked spaghetti and is inserted through your groin, wrist, or arm. The dye is injected into each artery, and X-rays are then taken to show if there is a blockage in the arteries of your heart. °LET YOUR HEALTH CARE PROVIDER KNOW ABOUT: °· Any allergies you have, including allergies to shellfish or contrast dye.   °· All medicines you are taking, including vitamins, herbs, eye drops, creams, and over-the-counter medicines.   °· Previous problems you or members of your family have had with the use of anesthetics.   °· Any blood disorders you have.   °· Previous surgeries you have had. °· History of kidney problems or failure.   °· Other medical conditions you have. °RISKS AND COMPLICATIONS  °Generally, coronary angiography is a safe procedure. However, as with any procedure, complications can occur. Possible complications include: °· Allergic reaction to the dye. °· Bleeding from the access site or other locations. °· Kidney injury, especially in people with impaired kidney function.  °· Stroke (rare). °· Heart attack (rare). °BEFORE THE PROCEDURE  °· Do not eat or drink anything after midnight the night before the procedure, or as directed by your health care provider.   °· Ask your health care provider if it is okay to take any needed medicines with a sip of water.   °PROCEDURE °· You may be given a medicine to help you relax (sedative) before the procedure. This medicine is given through an intravenous (IV) access tube that is inserted into one of your veins.   °· The area where the catheter will be inserted is washed and shaved. This is usually done in the groin but may be done in the fold of your arm (near your elbow) or in the wrist.    °· A medicine will be given to  numb the area where the catheter will be inserted (local anesthetic).   °· The health care provider will insert the catheter into an artery. The catheter is guided by using a special type of X-ray (fluoroscopy) of the blood vessel being examined.   °· A special dye is then injected into the catheter, and X-rays are taken. The dye helps to show where any narrowing or blockages are located in the heart arteries.   °AFTER THE PROCEDURE  °· If the procedure is done through the leg, you will be kept in bed lying flat for several hours. You will be instructed to not bend or cross your legs. °· The insertion site will be checked frequently.   °· The pulse in your feet or wrist will be checked frequently.   °· Additional blood tests, X-rays, and an electrocardiogram may be done.   °· You may need to stay in the hospital overnight for observation.   °Document Released: 01/02/2003 Document Revised: 02/28/2013 Document Reviewed: 11/20/2012 °[employer]® Patient Information ©2014 [employer]. ° °

## 2013-10-15 NOTE — Progress Notes (Signed)
    Subjective:  No complaints overnight.   Objective:  Vital Signs in the last 24 hours: Temp:  [97.8 F (36.6 C)-98.2 F (36.8 C)] 98.2 F (36.8 C) (04/06 0500) Pulse Rate:  [65-73] 65 (04/06 0500) Resp:  [16-18] 18 (04/06 0500) BP: (138-165)/(54-67) 138/60 mmHg (04/06 0500) SpO2:  [98 %-99 %] 99 % (04/06 0500) Weight:  [240 lb (108.863 kg)] 240 lb (108.863 kg) (04/06 0500)  Intake/Output from previous day:  Intake/Output Summary (Last 24 hours) at 10/15/13 0740 Last data filed at 10/14/13 2100  Gross per 24 hour  Intake    120 ml  Output    300 ml  Net   -180 ml    Physical Exam: General appearance: alert, cooperative, no distress and moderately obese Lungs: clear to auscultation bilaterally Heart: regular rate and rhythm   Rate: 77  Rhythm: normal sinus rhythm  Lab Results:  Recent Labs  10/14/13 0415 10/15/13 0500  WBC 9.4 7.9  HGB 13.6 12.3  PLT 179 168    Recent Labs  10/13/13 0338 10/15/13 0500  NA 141 140  K 4.0 3.8  CL 101 103  CO2 25 26  GLUCOSE 166* 160*  BUN 11 10  CREATININE 0.74 0.72    Recent Labs  10/12/13 1548 10/13/13 0337  TROPONINI 0.73* 0.42*    Recent Labs  10/12/13 1548  INR 0.94    Imaging: Imaging results have been reviewed  Cardiac Studies:  Assessment/Plan:  79 y/o female we have seen in the past. Her son is a Insurance underwriter[place]rologist [place] She had a cath in 2002 that revealed minor irregularities. She was seen again in July 2011 and had a coronary CT revealing mild, to at most, moderate Ca++ but no flow limiting lesions. She was admitted 10/12/13 with atypical symptoms but she had ST elevation on EMS rhythm strip but not on 12 lead. Her Troponin was positive and peaked at 0.73. She is for cath today.    Active Problems:   Elevated troponin   Abnormal finding on EKG   CAD, NATIVE VESSEL- mild CAD at cath 2002 and coronary CT 07/11   Diabetes mellitus type 2 in obese   OBESITY-MORBID   HYPERTENSION, BENIGN  LUNG NODULE- evaluated 2012   Palpitations    PLAN: Cath today. Please discuss results with her son- Dr Theodoro ParmaMichael Millspaugh- [phone].   Corine ShelterLuke Deronte Solis PA-C Beeper [phone] 10/15/2013, 7:40 AM

## 2013-10-15 NOTE — Progress Notes (Signed)
Utilization review completed.  

## 2013-10-15 NOTE — H&P (View-Only) (Signed)
    Subjective:  No chest pain or SOB  Objective:  Vital Signs in the last 24 hours: Temp:  [97.8 F (36.6 C)-97.9 F (36.6 C)] 97.8 F (36.6 C) (04/05 0500) Pulse Rate:  [73-80] 77 (04/05 0500) Resp:  [16-18] 16 (04/05 0500) BP: (144-155)/(56-62) 155/56 mmHg (04/05 0500) SpO2:  [95 %-99 %] 95 % (04/05 0500) Weight:  [238 lb (107.956 kg)] 238 lb (107.956 kg) (04/05 0500)  Intake/Output from previous day: No intake or output data in the 24 hours ending 10/14/13 0803  Physical Exam: General appearance: alert, cooperative, no distress and moderately obese Lungs: clear to auscultation bilaterally Heart: regular rate and rhythm   Rate: 77  Rhythm: normal sinus rhythm  Lab Results:  Recent Labs  10/13/13 0338 10/14/13 0415  WBC 8.9 9.4  HGB 12.7 13.6  PLT 169 179    Recent Labs  10/12/13 1255 10/13/13 0338  NA 137 141  K 4.4 4.0  CL 99 101  CO2 23 25  GLUCOSE 216* 166*  BUN 11 11  CREATININE 0.67 0.74    Recent Labs  10/12/13 1548 10/13/13 0337  TROPONINI 0.73* 0.42*    Recent Labs  10/12/13 1548  INR 0.94    Imaging: Imaging results have been reviewed  Cardiac Studies:  Assessment/Plan:   Active Problems:   Elevated troponin   Abnormal finding on EKG   CAD, NATIVE VESSEL- mild CAD at cath 2002 and coronary CT 07/11   Diabetes mellitus type 2 in obese   OBESITY-MORBID   HYPERTENSION, BENIGN   LUNG NODULE- evaluated 2012   Palpitations    PLAN: Cath in am.   Luke Kilroy PA-C Beeper [phone] 10/14/2013, 8:03 AM   See note earlier in Day Brian Crenshaw  

## 2013-10-15 NOTE — CV Procedure (Signed)
     Left Heart Catheterization with Coronary Angiography  Report  [name]  79 y.o.  female [birth year]  Procedure Date: 10/15/2013  Referring Physician: Shela CommonsJ. Hochrein Primary Cardiologist: Arvilla Meresaniel Bensimhon, MD  INDICATIONS:  Unstable angina / ACS  PROCEDURE: 1. Left heart cath; 2. Coronary angiography; 3 Left ventriculography  CONSENT:  The risks, benefits, and details of the procedure were explained in detail to the patient. Risks including death, stroke, heart attack, kidney injury, allergy, limb ischemia, bleeding and radiation injury were discussed.  The patient verbalized understanding and wanted to proceed.  Informed written consent was obtained.  PROCEDURE TECHNIQUE: Attempts to hit the right radial with an angiocath failed. We converted to the right femoral approach after 15 minutes and multiple sticks.  After Xylocaine anesthesia a 52F sheath was placed in the right femoral artery with an angiocath using the modified Seldinger technique.  Coronary angiography was done using a 5 F JR4 and JL 3.5 cm diagnostic catheters.  Left ventriculography was done using the JR4 catheter and hand injection.   Angioseal was used for hemostasis.   CONTRAST:  Total of 95 cc.  COMPLICATIONS:  none   HEMODYNAMICS:  Aortic pressure 175/80 mmHg; LV pressure 180/15 mmHg; LVEDP 25 mmHg  ANGIOGRAPHIC DATA:   The left main coronary artery is normal.  The left anterior descending artery is widely patent, containing proximal and mid vessel luminal irregularity..  The left circumflex artery is widely patent containing mid 30% stenosis..  The right coronary artery is widely patent and normal..   LEFT VENTRICULOGRAM:  Left ventricular angiogram was done in the 30 RAO projection and revealed wall motion and normal EF 60%   IMPRESSIONS:  1. Widely patent coronary arteries, with no angiographically significant stenoses noted.  2. Normal left ventricular function.  3. Angio-seal hemostasis  with no problems.   RECOMMENDATION:  Probable discharge later today.

## 2013-10-16 NOTE — Progress Notes (Signed)
Pt was in cath lab early am. I did not see her on this date. Plans per cath MD.  Tonny BollmanMichael Garnet Chatmon 10/16/2013 10:45 AM

## 2013-10-17 ENCOUNTER — Telehealth: Payer: Self-pay | Admitting: Physician Assistant

## 2013-10-17 NOTE — Telephone Encounter (Signed)
New Message:  Pt is calling with questions about her bandage care post hospital.

## 2013-10-17 NOTE — Telephone Encounter (Signed)
called pt about bandage? As I s/w pt I discovered she still had her bandage from her cath on 4/6. I advised pt to take bandage off. Pt states she has anxiety issues and they misunderstood instructions from hospital. pt afraid it will hurt. I advise pt to take a shower or bath and soak bandage to soften and then take off carefully. I asked pt if there was any redness or swelling at the site and she said no, that it looked good. Pt then states she needed to change her appt on 4/22 9:50 to later time, she has now been rsc to 10/31/13 @ 12:10, pt aware of time change. Pt verbalized understanding to Plan of Care.

## 2013-10-23 NOTE — Discharge Summary (Signed)
Patient ID: Lauren BangsDorothy H Thomas,  MRN: [id number], DOB/AGE: 77/26/1936 77 y.o.  Admit date: 10/12/2013 Discharge date: 10/23/2013  Primary Care Provider: Dr Cyndia BentBadger Primary Cardiologist: Dr Gala RomneyBensimhon   Discharge Diagnoses Active Problems:   Elevated troponin   Abnormal finding on EKG   CAD, NATIVE VESSEL- mild CAD at cath 2002 and coronary CT 07/11   Diabetes mellitus type 2 in obese   OBESITY-MORBID   HYPERTENSION, BENIGN   LUNG NODULE- evaluated 2012   Palpitations    Procedures: Coronary angiogram 10/15/13   Hospital Course:  79 y/o female we have seen in the past. Her son is a Insurance underwriter[place]rologist [place]. She had a cath in 2002 that revealed minor irregularities. She was seen again in July 2011 and had a coronary CT revealing mild to at most moderate Ca++ but no flow limiting lesions. She is obese and very sedentary. She has had a long history of palpations but has never had documented arrythmia. Early in the morning on 10/12/13 she got up to use the bathroom and went to check her pulse. She was not having palpitations, dizziness, or chest pain, she says she just frequently checks her pulse by palpitating her carotid. That morning she became concerned because she couldn't find her pulse. She called her primary care provider and was instructed to call EMS. EMS rhythm strips show NSR with ST elevation in lead 3 but this is not seen on several repeated 12 leads. She did admit to some vague epigastric discomfort when pressed. Her Troponin in the ER is 0.49. She was admitted for further evaluation. Initially we planned to do a Myoview but her Troponin continued to climb to 0.73 and it was decided to proceed with diagnostic cath. This was done 10/15/13 and revealed widely patent coronaries and normal LVF. She was discharged home later on the 6th.    Discharge Vitals:  Blood pressure 145/68, pulse 75, temperature 98.2 F (36.8 C), temperature source Oral, resp. rate 18, height 5\' 6"  (1.676 m), weight  240 lb (108.863 kg), SpO2 98.00%.    Labs: No results found for this or any previous visit (from the past 48 hour(s)).  Disposition:  Follow-up Information   Follow up with Tereso NewcomerScott Weaver, PA-C On 10/31/2013. (8:50 am)    Specialty:  Physician Assistant   Contact information:   [address] [place] [address] [phone]       Discharge Medications:    Medication List         acetaminophen 325 MG tablet  Commonly known as:  TYLENOL  Take 2 tablets (650 mg total) by mouth every 4 (four) hours as needed for mild pain, fever or headache.     ALPRAZolam 0.25 MG tablet  Commonly known as:  XANAX  Take 0.25 mg by mouth 2 (two) times daily as needed for anxiety.     aspirin 81 MG EC tablet  Take 1 tablet (81 mg total) by mouth daily.     AZO CRANBERRY 250-30 MG Tabs  Take 2 tablets by mouth daily.     beta carotene w/minerals tablet  Take 1 tablet by mouth daily.     guaiFENesin 600 MG 12 hr tablet  Commonly known as:  MUCINEX  Take 600 mg by mouth 2 (two) times daily as needed for cough or to loosen phlegm.     hyoscyamine 0.125 MG Tbdp disintergrating tablet  Commonly known as:  ANASPAZ  Place 0.125 mg under the tongue  every 6 (six) hours as needed for bladder spasms or cramping.     insulin NPH-regular Human (70-30) 100 UNIT/ML injection  Commonly known as:  NOVOLIN 70/30  Inject 50-70 Units into the skin See admin instructions. 70 units in the morning and 50 units in the evening     PRESCRIPTION MEDICATION  - Vaginal cream/wash prescribed from  Urologist  - Patient did not know the name of it and family has not been able to find it at any pharmacies.     SYSTANE 0.4-0.3 % Soln  Generic drug:  Polyethyl Glycol-Propyl Glycol  Place 1 drop into both eyes daily as needed (titching/dry eye).         Duration of Discharge Encounter: Greater than 30 minutes including physician time.  Jolene ProvostSigned, Sahil Milner PA-C 10/23/2013 8:38 AM

## 2013-10-26 NOTE — Discharge Summary (Signed)
Bevelyn Bucklesaniel R Anays Detore,MD 4:55 PM

## 2013-10-30 ENCOUNTER — Encounter: Payer: Self-pay | Admitting: *Deleted

## 2013-10-31 ENCOUNTER — Ambulatory Visit (INDEPENDENT_AMBULATORY_CARE_PROVIDER_SITE_OTHER): Payer: Medicare Other | Admitting: Physician Assistant

## 2013-10-31 ENCOUNTER — Encounter: Payer: Medicare PPO | Admitting: Physician Assistant

## 2013-10-31 ENCOUNTER — Encounter: Payer: Self-pay | Admitting: Physician Assistant

## 2013-10-31 VITALS — BP 155/88 | HR 83 | Ht 66.0 in | Wt 244.0 lb

## 2013-10-31 DIAGNOSIS — R002 Palpitations: Secondary | ICD-10-CM

## 2013-10-31 DIAGNOSIS — I2510 Atherosclerotic heart disease of native coronary artery without angina pectoris: Secondary | ICD-10-CM

## 2013-10-31 DIAGNOSIS — Z79899 Other long term (current) drug therapy: Secondary | ICD-10-CM

## 2013-10-31 DIAGNOSIS — I252 Old myocardial infarction: Secondary | ICD-10-CM

## 2013-10-31 DIAGNOSIS — I10 Essential (primary) hypertension: Secondary | ICD-10-CM

## 2013-10-31 MED ORDER — ATORVASTATIN CALCIUM 20 MG PO TABS: 20.0000 mg | ORAL_TABLET | Freq: Every day | ORAL | Status: DC

## 2013-10-31 MED ORDER — DILTIAZEM HCL ER COATED BEADS 120 MG PO CP24: 120.0000 mg | ORAL_CAPSULE | Freq: Every day | ORAL | Status: DC

## 2013-10-31 NOTE — Progress Notes (Signed)
[address] [place], [place]  [address] Phone: [phone] Fax:  [phone]  Date:  10/31/2013   ID:  Lauren Thomas, DOB 1935-07-11, MRN [id number]  PCP:  Eartha InchBADGER,MICHAEL C, MD  Cardiologist:  Dr. Arvilla Meresaniel Bensimhon in the past - will f/u with Dr. Rollene RotundaJames Hochrein     History of Present Illness: Lauren Thomas is a 79 y.o. female with a hx of minimal non-obstructive CAD by cath in 2002, diabetes, HTN, lung nodule.  She was admitted 4/3-4/6 with a NSTEMI.  She presented with chest pressure and her troponins were minimally elevated. There was transient STE in lead 3 (by EMS).  The CEs remained elevated in a flat pattern (0.49, 0.73, 0.42).  Cardiac cath was arranged and this demonstrated minimal disease in the CFX and normal LVF.    She returns for follow up. She continues to note a long history of irregular heartbeats. She seems to be describing PVCs versus PACs. She has occasional chest discomfort. This is likely related to palpitations. She denies exertional chest pain. She denies syncope or syncope. She denies significant dyspnea with exertion. She denies orthopnea, PND or edema.   Studies:  - LHC (10/15/13):  Mid CFX 30%, EF 60%    Recent Labs: 10/12/2013: ALT 16; TSH 1.140  10/13/2013: HDL Cholesterol 29*; LDL (calc) 87  10/15/2013: Creatinine 0.72; Hemoglobin 12.3; Potassium 3.8   Wt Readings from Last 3 Encounters:  10/31/13 244 lb (110.678 kg)  10/15/13 240 lb (108.863 kg)  10/15/13 240 lb (108.863 kg)     Past Medical History  Diagnosis Date  . Irregular heart rhythm   . Diabetes mellitus without complication   . Arthritis   . Macular degeneration   . Anxiety     Current Outpatient Prescriptions  Medication Sig Dispense Refill  . acetaminophen (TYLENOL) 325 MG tablet Take 2 tablets (650 mg total) by mouth every 4 (four) hours as needed for mild pain, fever or headache.      . ALPRAZolam (XANAX) 0.25 MG tablet Take 0.25 mg by mouth 2 (two) times daily as  needed for anxiety.      Marland Kitchen. aspirin EC 81 MG EC tablet Take 1 tablet (81 mg total) by mouth daily.      . beta carotene w/minerals (OCUVITE) tablet Take 1 tablet by mouth daily.      . Cranberry-Vitamin C-Probiotic (AZO CRANBERRY) 250-30 MG TABS Take 2 tablets by mouth daily.      Marland Kitchen. guaiFENesin (MUCINEX) 600 MG 12 hr tablet Take 600 mg by mouth 2 (two) times daily as needed for cough or to loosen phlegm.      . hyoscyamine (ANASPAZ) 0.125 MG TBDP disintergrating tablet Place 0.125 mg under the tongue every 6 (six) hours as needed for bladder spasms or cramping.      . insulin NPH-regular Human (NOVOLIN 70/30) (70-30) 100 UNIT/ML injection Inject 50-70 Units into the skin See admin instructions. 70 units in the morning and 50 units in the evening      . Insulin Syringe-Needle U-100 (INSULIN SYRINGE 1CC/31GX5/16") 31G X 5/16" 1 ML MISC       . ONE TOUCH ULTRA TEST test strip       . Polyethyl Glycol-Propyl Glycol (SYSTANE) 0.4-0.3 % SOLN Place 1 drop into both eyes daily as needed (titching/dry eye).      Marland Kitchen. PRESCRIPTION MEDICATION Vaginal cream/wash prescribed from  Urologist Patient did not know the name of it and family has not been  able to find it at any pharmacies.       No current facility-administered medications for this visit.    Allergies:   Erythromycin; Penicillins; Sulfonamide derivatives; Latex; Neosporin; and Polysporin   Social History:  The patient  reports that she has never smoked. She has never used smokeless tobacco. She reports that she does not drink alcohol or use illicit drugs.   Family History:  The patient's family history is not on file.   ROS:  Please see the history of present illness.      All other systems reviewed and negative.   PHYSICAL EXAM: VS:  BP 155/88  Pulse 83  Ht 5\' 6"  (1.676 m)  Wt 244 lb (110.678 kg)  BMI 39.40 kg/m2 Well nourished, well developed, in no acute distress HEENT: normal Neck: no JVD Cardiac:  normal S1, S2; RRR; no murmur Lungs:   clear to auscultation bilaterally, no wheezing, rhonchi or rales Abd: soft, nontender, no hepatomegaly Ext: no edemaright groin without hematoma or bruit  Skin: warm and dry Neuro:  CNs 2-12 intact, no focal abnormalities noted  EKG:  NSR, HR 83, normal axis, nonspecific ST-T wave changes, no change from prior tracing     ASSESSMENT AND PLAN:  1. History of non-ST elevation myocardial infarction (NSTEMI): Etiology not clear. Her troponin elevation was fairly flat. She had normal wall motion and normal LV function at cardiac catheterization. Question if she had coronary vasospasm. She does have a history of palpitations. I will place her on Cardizem CD 120 mg daily. I will also place her on Lipitor 20 mg daily. 2. CAD (coronary artery disease), native coronary artery: Continue aspirin, statin. Start calcium channel blocker for possible coronary vasospasm.  Obtain lipids and LFTs in 6 weeks. 3. Palpitations: Start diltiazem (CARDIZEM CD) 120 MG 24 hr capsule as noted above. Hopefully this will alleviate some of her palpitations. I suspect that these are PVCs versus PACs. 4. HTN (hypertension): Start diltiazem as noted above. 5. Disposition: Follow up with Dr. Antoine PocheHochrein in 6-8 weeks.  Signed, Tereso NewcomerScott Oleta Gunnoe, PA-C  10/31/2013 1:03 PM

## 2013-10-31 NOTE — Patient Instructions (Signed)
START DILTIAZEM CD 120 MG DAILY  START LIPITOR 20 MG DAILY  Both Rx's have been sent to your pharmacy  Your physician recommends that you schedule a follow-up appointment in: ov with Dr Antoine PocheHochrein 6-8 weeks  Return in 6 weeks for fasting labs (hfp/lp)

## 2013-11-01 ENCOUNTER — Encounter: Payer: Medicare PPO | Admitting: Physician Assistant

## 2013-12-11 ENCOUNTER — Telehealth: Payer: Self-pay | Admitting: Cardiology

## 2013-12-11 NOTE — Telephone Encounter (Signed)
Pt states she is not currently taking lipitor, she never picked up the prescription. She wants to discuss with her son, a doctor, before she starts taking lipitor.

## 2013-12-11 NOTE — Telephone Encounter (Signed)
New Message  Pt requests a call back to discuss if her lab is needed//SR

## 2013-12-12 ENCOUNTER — Other Ambulatory Visit: Payer: Medicare Other

## 2013-12-26 ENCOUNTER — Ambulatory Visit: Payer: Medicare Other | Admitting: Cardiology

## 2014-01-03 ENCOUNTER — Telehealth (HOSPITAL_COMMUNITY): Payer: Self-pay | Admitting: Unknown Physician Specialty

## 2014-01-25 ENCOUNTER — Encounter: Payer: Self-pay | Admitting: Cardiology

## 2014-01-25 ENCOUNTER — Ambulatory Visit (INDEPENDENT_AMBULATORY_CARE_PROVIDER_SITE_OTHER): Payer: Medicare Other | Admitting: Cardiology

## 2014-01-25 VITALS — BP 120/80 | HR 88 | Ht 66.0 in | Wt 238.0 lb

## 2014-01-25 DIAGNOSIS — I2510 Atherosclerotic heart disease of native coronary artery without angina pectoris: Secondary | ICD-10-CM

## 2014-01-25 DIAGNOSIS — I10 Essential (primary) hypertension: Secondary | ICD-10-CM

## 2014-01-25 NOTE — Progress Notes (Signed)
HPI The patient presents for followup of enzyme elevation. She was admitted in April with elevated enzymes and some chest pressure. She did not have coronary disease. She was seen in followup by Tereso Newcomer PAc and now comes back to establish with me.  At the last visit she was started on Cardizem for palpitations and Lipitor. However, she didn't take either of these medicines. Since that time she has not had severe palpitations.  She has these rarely.  The patient denies any new symptoms such as chest discomfort, neck or arm discomfort. There has been no new shortness of breath, PND or orthopnea. There have been no reported palpitations, presyncope or syncope.  She does have stress related to her roommate and her loss of vision.    Allergies  Allergen Reactions  . Erythromycin     Heart flutter  . Penicillins     unknown  . Sulfonamide Derivatives     diarrhea  . Latex Rash  . Neosporin [Neomycin-Bacitracin Zn-Polymyx] Rash  . Polysporin [Bacitracin-Polymyxin B] Rash    Current Outpatient Prescriptions  Medication Sig Dispense Refill  . ALPRAZolam (XANAX) 0.25 MG tablet Take 0.25 mg by mouth 2 (two) times daily as needed for anxiety.      Marland Kitchen aspirin EC 81 MG EC tablet Take 1 tablet (81 mg total) by mouth daily.      . beta carotene w/minerals (OCUVITE) tablet Take 1 tablet by mouth daily. VITAMIN INCLUDES ADULT OVER 50      . Cranberry-Vitamin C-Probiotic (AZO CRANBERRY) 250-30 MG TABS Take 2 tablets by mouth daily.      Marland Kitchen guaiFENesin (MUCINEX) 600 MG 12 hr tablet Take 600 mg by mouth 2 (two) times daily as needed for cough or to loosen phlegm.      . hydroxypropyl methylcellulose (ISOPTO TEARS) 2.5 % ophthalmic solution 1 drop. AS DIRECTED- ALSO KNOWN AS ARTIFICAL TEARS      . hyoscyamine (ANASPAZ) 0.125 MG TBDP disintergrating tablet Place 0.125 mg under the tongue every 6 (six) hours as needed for bladder spasms or cramping.      . insulin NPH-regular Human (NOVOLIN 70/30) (70-30)  100 UNIT/ML injection Inject 50-70 Units into the skin See admin instructions. 70 units in the morning and 50 units in the evening      . Insulin Syringe-Needle U-100 (INSULIN SYRINGE 1CC/31GX5/16") 31G X 5/16" 1 ML MISC       . ONE TOUCH ULTRA TEST test strip       . PRESCRIPTION MEDICATION Vaginal cream/wash prescribed from  Urologist Patient did not know the name of it and family has not been able to find it at any pharmacies.       No current facility-administered medications for this visit.    Past Medical History  Diagnosis Date  . Irregular heart rhythm   . Diabetes mellitus without complication   . Arthritis   . Macular degeneration   . Anxiety     Past Surgical History  Procedure Laterality Date  . Back surgery    . Abdominal hysterectomy    . Cholecystectomy    . Cataract extraction      ROS:  Decreased vision.  Otherwise as stated in the HPI and negative for all other systems.  PHYSICAL EXAM BP 120/80  Pulse 88  Ht 5\' 6"  (1.676 m)  Wt 238 lb (107.956 kg)  BMI 38.43 kg/m2 GENERAL:  Looks older than stated age HEENT:  Pupils equal round and reactive, fundi not visualized, oral  mucosa unremarkable NECK:  No jugular venous distention, waveform within normal limits, carotid upstroke brisk and symmetric, no bruits, no thyromegaly LYMPHATICS:  No cervical, inguinal adenopathy LUNGS:  Clear to auscultation bilaterally BACK:  No CVA tenderness CHEST:  Unremarkable HEART:  PMI not displaced or sustained,S1 and S2 within normal limits, no S3, no S4, no clicks, no rubs, no murmurs ABD:  Flat, positive bowel sounds normal in frequency in pitch, no bruits, no rebound, no guarding, no midline pulsatile mass, no hepatomegaly, no splenomegaly EXT:  2 plus pulses throughout, no edema, no cyanosis no clubbing SKIN:  No rashes no nodules NEURO:  Cranial nerves II through XII grossly intact, motor grossly intact throughout, diffuse muscle weakness.  PSYCH:  Cognitively intact,  oriented to person place and time  EKG:  Sinus rhythm, rate 94, axis within normal limits, intervals within normal limits, premature atrial contractions, no acute ST-T wave changes.  01/25/2014   ASSESSMENT AND PLAN  Elevated Troponin:  She had minimal coronary plaque and was to be treated for possible vasospasm.  However, she does not like taking Cardizem. At this point without any symptoms I think he would be reasonable for her to remain on this medication. She will oh she has any cardiovascular symptoms. We had a long discussion about this. I did review hospital records. I reviewed her catheterization.  Palpitations:   She would prefer not to take medications. We discussed avoidance of caffeine. She'll let us know if this gets worse.  HTN (hypertension): The blood pressure is at target. No change in medications is indicated. We will continue with therapeutic lifestyle changes (TLC).  LIPIDS:  It was suggested that she should take a statin. However, I reviewed the lipids I have from earlier this year and her LDL was only 87. Therefore, I would not start Lipitor.

## 2014-01-25 NOTE — Patient Instructions (Signed)
Your physician recommends that you schedule a follow-up appointment in: As needed  

## 2014-06-20 ENCOUNTER — Encounter (HOSPITAL_COMMUNITY): Payer: Self-pay | Admitting: Interventional Cardiology

## 2014-10-18 ENCOUNTER — Encounter: Payer: Self-pay | Admitting: Podiatrist

## 2014-10-18 ENCOUNTER — Ambulatory Visit (INDEPENDENT_AMBULATORY_CARE_PROVIDER_SITE_OTHER): Payer: Medicare Other | Admitting: Podiatrist

## 2014-10-18 DIAGNOSIS — M79676 Pain in unspecified toe(s): Secondary | ICD-10-CM | POA: Diagnosis not present

## 2014-10-18 DIAGNOSIS — B351 Tinea unguium: Secondary | ICD-10-CM | POA: Diagnosis not present

## 2014-10-18 NOTE — Progress Notes (Signed)
   Subjective:    Patient ID: Lauren Thomas, female    DOB: August 31, 1934, 80 y.o.   MRN: [id number]  HPI  PT STATED LT FOOT HAVE NUMBNESS FEELING AND SORE FOR 1 WEEK. THE FOOT IS DOING MUCH BETTER BUT SOMETIMES IT FEELS IS MORE NUMB. TRIED SOAK, AND NO HELP. ALSO, NEED TO GET TRIM THE TOENAILS.  Review of Systems  Eyes: Positive for pain and visual disturbance.  Musculoskeletal: Positive for gait problem.  All other systems reviewed and are negative.      Objective:   Physical Exam Chief Complaint  Patient presents with  . Foot Pain     HPI: Patient is 80 y.o. female who presents today for foot pain and toenails   Allergies  Allergen Reactions  . Erythromycin     Heart flutter  . Penicillins     unknown  . Sulfonamide Derivatives     diarrhea  . Latex Rash  . Neosporin [Neomycin-Bacitracin Zn-Polymyx] Rash  . Polysporin [Bacitracin-Polymyxin B] Rash    Physical Exam  Patient is awake, alert, and oriented x 3.  In no acute distress.  Vascular status is intact with palpable pedal pulses at 2/4 DP and PT bilateral and capillary refill time within normal limits. Neurological sensation is also intact bilaterally via Semmes Weinstein monofilament at 5/5 sites. Light touch, vibratory sensation, Achilles tendon reflex is intact. Dermatological exam reveals skin color, turger and texture as normal. No open lesions present.  Musculature intact with dorsiflexion, plantarflexion, inversion, eversion. Left foot is painful along the plantar aspect of the foot. She relates her sciatic is acting up.  Toenails are discolored and mycotic x 10  Assessment:foot pai n due to sciatica, painful mycotic nails  Plan: discussed that sciatic does cause significant heel pain. Recommended stretches and shoe changes as well.  debridment of nails accomplished today. She will return for recheck and follow up.

## 2015-01-23 ENCOUNTER — Emergency Department (HOSPITAL_COMMUNITY)
Admission: EM | Admit: 2015-01-23 | Discharge: 2015-01-24 | Disposition: A | Discharge status: Home / Self Care | Payer: Medicare Other | Attending: Emergency Medicine | Admitting: Emergency Medicine

## 2015-01-23 ENCOUNTER — Encounter (HOSPITAL_COMMUNITY): Payer: Self-pay

## 2015-01-23 DIAGNOSIS — F419 Anxiety disorder, unspecified: Secondary | ICD-10-CM | POA: Insufficient documentation

## 2015-01-23 DIAGNOSIS — Z79899 Other long term (current) drug therapy: Secondary | ICD-10-CM | POA: Diagnosis not present

## 2015-01-23 DIAGNOSIS — E119 Type 2 diabetes mellitus without complications: Secondary | ICD-10-CM | POA: Insufficient documentation

## 2015-01-23 DIAGNOSIS — M1990 Unspecified osteoarthritis, unspecified site: Secondary | ICD-10-CM | POA: Diagnosis not present

## 2015-01-23 DIAGNOSIS — Z91040 Latex allergy status: Secondary | ICD-10-CM | POA: Insufficient documentation

## 2015-01-23 DIAGNOSIS — Z8679 Personal history of other diseases of the circulatory system: Secondary | ICD-10-CM | POA: Diagnosis not present

## 2015-01-23 DIAGNOSIS — Z880 Allergy status to penicillin: Secondary | ICD-10-CM | POA: Insufficient documentation

## 2015-01-23 DIAGNOSIS — Z794 Long term (current) use of insulin: Secondary | ICD-10-CM | POA: Diagnosis not present

## 2015-01-23 DIAGNOSIS — Z9861 Coronary angioplasty status: Secondary | ICD-10-CM | POA: Diagnosis not present

## 2015-01-23 DIAGNOSIS — H3530 Unspecified macular degeneration: Secondary | ICD-10-CM | POA: Insufficient documentation

## 2015-01-23 LAB — BASIC METABOLIC PANEL
Anion gap: 7 (ref 5–15)
BUN: 9 mg/dL (ref 6–20)
CO2: 26 mmol/L (ref 22–32)
CREATININE: 0.85 mg/dL (ref 0.44–1.00)
Calcium: 8.7 mg/dL — ABNORMAL LOW (ref 8.9–10.3)
Chloride: 103 mmol/L (ref 101–111)
GFR calc Af Amer: >60 mL/min (ref >60)
GFR calc non Af Amer: >60 mL/min (ref >60)
GLUCOSE: 179 mg/dL — AB (ref 65–99)
Potassium: 4.9 mmol/L (ref 3.5–5.1)
SODIUM: 136 mmol/L (ref 135–145)

## 2015-01-23 LAB — URINALYSIS, ROUTINE W REFLEX MICROSCOPIC
Bilirubin Urine: NEGATIVE
Glucose, UA: NEGATIVE mg/dL
Hgb urine dipstick: NEGATIVE
Ketones, ur: NEGATIVE mg/dL
LEUKOCYTES UA: NEGATIVE
Nitrite: NEGATIVE
PROTEIN: NEGATIVE mg/dL
SPECIFIC GRAVITY, URINE: 1.011 (ref 1.005–1.030)
UROBILINOGEN UA: 0.2 mg/dL (ref 0.0–1.0)
pH: 6 (ref 5.0–8.0)

## 2015-01-23 LAB — CBC
HCT: 39.1 % (ref 36.0–46.0)
HEMOGLOBIN: 13.4 g/dL (ref 12.0–15.0)
MCH: 29.5 pg (ref 26.0–34.0)
MCHC: 34.3 g/dL (ref 30.0–36.0)
MCV: 86.1 fL (ref 78.0–100.0)
PLATELETS: 165 10*3/uL (ref 150–400)
RBC: 4.54 MIL/uL (ref 3.87–5.11)
RDW: 13.7 % (ref 11.5–15.5)
WBC: 7.5 10*3/uL (ref 4.0–10.5)

## 2015-01-23 NOTE — ED Notes (Signed)
Bed: AV40WA13 Expected date:  Expected time:  Means of arrival:  Comments: EMS 80 yo female/anxious, "out of it"-? Took too much xanax or did not take xanax

## 2015-01-23 NOTE — ED Provider Notes (Signed)
CSN: [id number]     Arrival date & time 01/23/15  1955 History   First MD Initiated Contact with Patient 01/23/15 1957     Chief Complaint  Patient presents with  . Anxiety     (Consider location/radiation/quality/duration/timing/severity/associated sxs/prior Treatment) HPI Comments: The patient is a 80 year old female, she has a history of anxiety but also has a history of diabetes, she presents to the hospital complaining of anxiety. She states that she was confused when she woke up from a long nap and her housemate told her that it was 8:00 at night when she thought it was 8:00 in the morning. From this she thought she might be anxious, she did not know if she took her medications today, she takes occasional as needed anxiety medication in the form of Xanax. She is unclear whether she took it. She has no history of dementia or confusion, she has not been feeling physically ill and has no complaints of chest pain shortness of breath back pain headache neck pain nausea vomiting diarrhea dysuria rashes swelling.  Paramedics transported the patient, they noted normal vital signs, no significant findings, no tremor, no psychosis, no depression, no suicidal thoughts. According to the paramedics the patient is checked frequently by paramedics and fire for panic attacks. She cares for her significant other who is over 105 years old and she feels this is part of the reason that she is anxious.  Patient is a 80 y.o. female presenting with anxiety. The history is provided by the patient.  Anxiety    Past Medical History  Diagnosis Date  . Irregular heart rhythm   . Diabetes mellitus without complication   . Arthritis   . Macular degeneration   . Anxiety    Past Surgical History  Procedure Laterality Date  . Back surgery    . Abdominal hysterectomy    . Cholecystectomy    . Cataract extraction    . Left heart catheterization with coronary angiogram N/A 10/15/2013    Procedure: LEFT HEART  CATHETERIZATION WITH CORONARY ANGIOGRAM;  Surgeon: Lesleigh Noe, MD;  [facility] C[facility] LAB;  Service: Cardiovascular;  Laterality: N/A;   No family history on file. History  Substance Use Topics  . Smoking status: Never Smoker   . Smokeless tobacco: Never Used  . Alcohol Use: No   OB History    No data available     Review of Systems  All other systems reviewed and are negative.     Allergies  Erythromycin; Penicillins; Sulfonamide derivatives; Latex; Neosporin; and Polysporin  Home Medications   Prior to Admission medications   Medication Sig Start Date End Date Taking? Authorizing Provider  ALPRAZolam (XANAX) 0.25 MG tablet Take 0.25 mg by mouth 2 (two) times daily as needed for anxiety.   Yes Historical Provider, MD  aspirin EC 81 MG EC tablet Take 1 tablet (81 mg total) by mouth daily. 10/15/13   Abelino Derrick, PA-C  beta carotene w/minerals (OCUVITE) tablet Take 1 tablet by mouth daily. VITAMIN INCLUDES ADULT OVER 50    Historical Provider, MD  Cranberry-Vitamin C-Probiotic (AZO CRANBERRY) 250-30 MG TABS Take 2 tablets by mouth daily.    Historical Provider, MD  guaiFENesin (MUCINEX) 600 MG 12 hr tablet Take 600 mg by mouth 2 (two) times daily as needed for cough or to loosen phlegm.    Historical Provider, MD  hydroxypropyl methylcellulose (ISOPTO TEARS) 2.5 % ophthalmic solution 1 drop. AS DIRECTED- ALSO KNOWN AS ARTIFICAL TEARS  Historical Provider, MD  hyoscyamine (ANASPAZ) 0.125 MG TBDP disintergrating tablet Place 0.125 mg under the tongue every 6 (six) hours as needed for bladder spasms or cramping.    Historical Provider, MD  Insulin Syringe-Needle U-100 (INSULIN SYRINGE 1CC/31GX5/16") 31G X 5/16" 1 ML MISC  08/31/13   Historical Provider, MD  ONE TOUCH ULTRA TEST test strip  07/31/13   Historical Provider, MD  PRESCRIPTION MEDICATION Vaginal cream/wash prescribed from  Urologist Patient did not know the name of it and family has not been able to find it at any  pharmacies.    Historical Provider, MD   BP 151/60 mmHg  Pulse 80  Temp(Src) 98.1 F (36.7 C) (Oral)  Ht 5\' 6"  (1.676 m)  Wt 235 lb (106.595 kg)  BMI 37.95 kg/m2  SpO2 98% Physical Exam  Constitutional: She appears well-developed and well-nourished. No distress.  HENT:  Head: Normocephalic and atraumatic.  Mouth/Throat: Oropharynx is clear and moist. No oropharyngeal exudate.  Eyes: Conjunctivae and EOM are normal. Pupils are equal, round, and reactive to light. Right eye exhibits no discharge. Left eye exhibits no discharge. No scleral icterus.  Neck: Normal range of motion. Neck supple. No JVD present. No thyromegaly present.  Cardiovascular: Normal rate, regular rhythm, normal heart sounds and intact distal pulses.  Exam reveals no gallop and no friction rub.   No murmur ( is) heard. Pulmonary/Chest: Effort normal and breath sounds normal. No respiratory distress. She has no wheezes. She has no rales.  Abdominal: Soft. Bowel sounds are normal. She exhibits no distension and no mass. There is no tenderness.  Musculoskeletal: Normal range of motion. She exhibits no edema or tenderness.  Lymphadenopathy:    She has no cervical adenopathy.  Neurological: She is alert. Coordination normal.  Skin: Skin is warm and dry. No rash noted. No erythema.  Psychiatric: She has a normal mood and affect. Her behavior is normal.  Nursing note and vitals reviewed.   ED Course  Procedures (including critical care time) Labs Review Labs Reviewed  BASIC METABOLIC PANEL - Abnormal; Notable for the following:    Glucose, Bld 179 (*)    Calcium 8.7 (*)    All other components within normal limits  URINALYSIS, ROUTINE W REFLEX MICROSCOPIC (NOT AT [facility]) - Abnormal; Notable for the following:    APPearance CLOUDY (*)    All other components within normal limits  CBC    Imaging Review No results found.   MDM   Final diagnoses:  Anxiety    The patient is well-appearing, she does not have a  tremor, she ambulated to the bathroom without difficulty, she does not appear anxious or depressed. We'll check some basic labs, she states she has been having some urinary discomfort, she does have irritable bowel syndrome and varies between mild constipation and mild diarrhea. None of this is new for her.  Not appearing anxious - all labs neg, pt stable for d/c.   Eber HongBrian Kailash Hinze, MD 01/23/15 [id number]

## 2015-01-23 NOTE — ED Notes (Signed)
Patient arrives by EMS with complaints of anxiety.  EMS states frequent checks by EMS and Fire for panic attacks.  EMS states she care for her husband who has had a stroke.  Patient told EMS she could not remember if she took her xanax today.

## 2015-01-23 NOTE — Progress Notes (Addendum)
EDCM spoke to patient at bedside.  Patient lives at home with her "house mate" [name].  [name] has a Administrator, sportsdefibulator and has had a stroke in the past.  Patient presents to ED with anxiety which may be caused by patient's home situation.  Patient's ex husband comes to help take care of [name] too.  Patient reports [name] "gets around very well."  Patient reports her pcp Dr. Antony HasteMichael Badger has arranged for a visiting RN to come see her to help her with her medications at home.  Patient reports she has memory difficulties. Patient ambulates with a walker at home and reports she is able to complete her ADL's without difficulty.  Patient reports her pcp has started her on five new medications and she doesn't know what they are or what they do.  Patient reports the nurse was supposed to call her today but she came here.  EDCM explained [facility] to patient.  Patient is interested in services, referral placed.  EDCM also provided patient with list of private duty nursing agencies and explained it would be an out of pocket expense.  Patient verbalized understanding.  Patient thankful for services.  No further EDCM needs at this time.

## 2015-05-27 ENCOUNTER — Other Ambulatory Visit: Payer: Self-pay | Admitting: Physician Assistant

## 2015-05-28 ENCOUNTER — Other Ambulatory Visit: Payer: Self-pay | Admitting: Physician Assistant

## 2015-05-28 NOTE — Telephone Encounter (Signed)
Patient has not been seen since July 2015, but is prn follow up. Ok to refill or refuse and send to pcp? Please advise. Thanks, MI

## 2015-05-28 NOTE — Telephone Encounter (Signed)
Rx has been sent to the pharmacy electronically. ° °

## 2015-05-28 NOTE — Telephone Encounter (Signed)
Patient has not been seen since July 2015, but is prn follow up. Ok to refill or refuse and send to pcp? Please advise. Thanks, MI 

## 2015-08-11 ENCOUNTER — Encounter: Payer: Self-pay | Admitting: Neurology

## 2015-08-11 ENCOUNTER — Ambulatory Visit (INDEPENDENT_AMBULATORY_CARE_PROVIDER_SITE_OTHER): Payer: Medicare Other | Admitting: Neurology

## 2015-08-11 VITALS — BP 116/70 | HR 85

## 2015-08-11 DIAGNOSIS — R269 Unspecified abnormalities of gait and mobility: Secondary | ICD-10-CM | POA: Diagnosis not present

## 2015-08-11 DIAGNOSIS — G20 Parkinson's disease: Secondary | ICD-10-CM | POA: Insufficient documentation

## 2015-08-11 MED ORDER — CARBIDOPA-LEVODOPA 25-100 MG PO TABS: 1.0000 | ORAL_TABLET | Freq: Three times a day (TID) | ORAL | Status: DC

## 2015-08-11 NOTE — Progress Notes (Signed)
PATIENT: Lauren Thomas DOB: Feb 21, 1935  Chief Complaint  Patient presents with  . Memory Loss    MMSE 24/27 (unable to complete last three due to macular degeneration) - 8 animals.  She is here with her husband, Lauren Thomas and her housemate, Lauren Thomas.  She is here with concerns of memory loss that has progressively worsened over time.     HISTORICAL  Lauren Thomas she is a 81 years old right-handed female, accompanied by her husband at the, and her house mate Lauren Thomas, seen in refer by her primary care physician Dr. Eartha Inch in Aug 11 2015 for evaluation of memory loss.  She had history of insulin dependent diabetes since 1997, macular degeneration,with poor vision, she could nolonge read newpaer, could not tell features of face.  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  She has no family history of dementia, she denies visual hallucinations  REVIEW OF SYSTEMS: Full 14 system review of systems performed and notable only for chill, fatigue, itching, loss of vision, urination problems, incontinence, easy bruising, joint swelling, joint pain, cramps, achy muscles, skin sensitivity, memory loss, confusion, numbness, dizziness, insomnia, sleepiness, depression, anxiety, change in appetite  ALLERGIES: Allergies  Allergen Reactions  . Erythromycin     Heart flutter  . Penicillins     unknown  . Red Dye   . Sulfonamide Derivatives     diarrhea  . Latex Rash  . Neosporin [Neomycin-Bacitracin  Zn-Polymyx] Rash  . Polysporin [Bacitracin-Polymyxin B] Rash    HOME MEDICATIONS: Current Outpatient Prescriptions  Medication Sig Dispense Refill  . ALPRAZolam (XANAX) 0.25 MG tablet Take 0.25 mg by mouth 2 (two) times daily as needed for anxiety.    Marland Kitchen guaiFENesin (MUCINEX) 600 MG 12 hr tablet Take 600 mg by mouth 2 (two) times daily as needed for cough or to loosen phlegm.    . hydroxypropyl methylcellulose (ISOPTO TEARS) 2.5 % ophthalmic solution 1 drop. AS DIRECTED- ALSO KNOWN AS ARTIFICAL TEARS    . hyoscyamine (ANASPAZ) 0.125 MG TBDP disintergrating tablet Place 0.125 mg under the tongue every 6 (six) hours as needed for bladder spasms or cramping.    . Insulin Syringe-Needle U-100 (INSULIN SYRINGE 1CC/31GX5/16") 31G X 5/16" 1 ML MISC     . ONE TOUCH ULTRA TEST test strip     . PRESCRIPTION MEDICATION Vaginal cream/wash prescribed from  Urologist Patient did not know the name of it and family has not been able to find it at any pharmacies.     No current facility-administered medications for this visit.    PAST MEDICAL HISTORY: Past Medical History  Diagnosis Date  . Irregular heart rhythm   . Diabetes mellitus without complication (HCC)   . Arthritis   . Macular degeneration   . Anxiety   . Memory loss     PAST SURGICAL HISTORY: Past Surgical History  Procedure Laterality Date  . Back surgery    . Abdominal hysterectomy    . Cholecystectomy    . Cataract extraction    .  Left heart catheterization with coronary angiogram N/A 10/15/2013    Procedure: LEFT HEART CATHETERIZATION WITH CORONARY ANGIOGRAM;  Surgeon: Lesleigh Noe, MD;  Location: [facility] CATH LAB;  Service: Cardiovascular;  Laterality: N/A;    FAMILY HISTORY: Family History  Problem Relation Age of Onset  . Heart attack Mother   . Heart disease Mother   . Prostate cancer Father   . Diabetes Mother   . Diabetes Maternal Grandmother   . Heart disease Brother   . Lung cancer Brother     SOCIAL  HISTORY:  Social History   Social History  . Marital Status: Legally Separated    Spouse Name: N/A  . Number of Children: 3  . Years of Education: BA   Occupational History  . Retired    Social History Main Topics  . Smoking status: Never Smoker   . Smokeless tobacco: Never Used  . Alcohol Use: No  . Drug Use: No  . Sexual Activity: Not on file   Other Topics Concern  . Not on file   Social History Narrative   Lives at home with her friend, Carmelia Roller.   Right-handed.   2-3 cups caffeine daily.     PHYSICAL EXAM   Filed Vitals:   08/11/15 1421  BP: 116/70  Pulse: 85    Not recorded      There is no weight on file to calculate BMI.  PHYSICAL EXAMNIATION:  Gen: NAD, conversant, well nourised, obese, well groomed                     Cardiovascular: Regular rate rhythm, no peripheral edema, warm, nontender. Eyes: Conjunctivae clear without exudates or hemorrhage Neck: Supple, no carotid bruise. Pulmonary: Clear to auscultation bilaterally   NEUROLOGICAL EXAM:  MENTAL STATUS: Speech:    Speech is normal; fluent and spontaneous with normal comprehension.  Cognition: Mini-Mental Status Examination is 24 of 27 she has poor vision could not write or copy design     Orientation: She is not oriented to date.     Recent and remote memory: she missed 1/3 recall     Normal Attention span and concentration     Normal Language, naming, repeating,spontaneous speech     Fund of knowledge   CRANIAL NERVES: CN II: Visual fields are full to confrontation. Pupils are round equal and sluggish reactive to light. CN III, IV, VI: extraocular movement are normal. No ptosis. CN V: Facial sensation is intact to pinprick in all 3 divisions bilaterally. Corneal responses are intact.  CN VII: Face is symmetric with normal eye closure and smile. CN VIII: Hearing is normal to rubbing fingers CN IX, X: Palate elevates symmetrically. Phonation is normal. CN XI: Head turning and  shoulder shrug are intact CN XII: Tongue is midline with normal movements and no atrophy.  MOTOR: She has decreased facial expression, mild to moderate limb and nuchal rigidity, no weakness, bradykinesia  REFLEXES: Reflexes are 2+ and symmetric at the biceps, triceps, knees, and ankles. Plantar responses are flexor.  SENSORY: Intact to light touch, pinprick, position sense, and vibration sense are intact in fingers and toes.  COORDINATION: Rapid alternating movements and fine finger movements are intact. There is no dysmetria on finger-to-nose and heel-knee-shin.    GAIT/STANCE: She needs assistance from seated position, rely on her walker, difficult to initiate gait  DIAGNOSTIC DATA (LABS, IMAGING, TESTING) - I reviewed patient records, labs, notes, testing and imaging myself where available.   ASSESSMENT AND  PLAN  AMARANTA MEHL is a 81 y.o. female   Mild cognitive impairment  Mini-Mental Status Examination is 24 out of 25 Parkinsonian features  Differentiation diagnosis includes central nervous system degenerative disorder, dementia with parkinsonian features versus Lewy body dementia   Complete evaluation with MRI of brain, laboratory evaluations,  Try Sinemet 25/100 mg 3 times a day  Levert Feinstein, M.D. Ph.D.  [facility] [address] [place], [place] [address] Ph: [phone] Fax: [phone]  CC: Eartha Inch, MD

## 2015-08-12 LAB — CBC
Hematocrit: 41.2 % (ref 34.0–46.6)
Hemoglobin: 14.2 g/dL (ref 11.1–15.9)
MCH: 30 pg (ref 26.6–33.0)
MCHC: 34.5 g/dL (ref 31.5–35.7)
MCV: 87 fL (ref 79–97)
PLATELETS: 208 10*3/uL (ref 150–379)
RBC: 4.73 x10E6/uL (ref 3.77–5.28)
RDW: 14.6 % (ref 12.3–15.4)
WBC: 9.9 10*3/uL (ref 3.4–10.8)

## 2015-08-12 LAB — VITAMIN B12: Vitamin B-12: 220 pg/mL (ref 211–946)

## 2015-08-12 LAB — THYROID PANEL WITH TSH
FREE THYROXINE INDEX: 2 (ref 1.2–4.9)
T3 UPTAKE RATIO: 28 % (ref 24–39)
T4, Total: 7 ug/dL (ref 4.5–12.0)
TSH: 0.982 u[IU]/mL (ref 0.450–4.500)

## 2015-08-12 LAB — SEDIMENTATION RATE: Sed Rate: 20 mm/hr (ref 0–40)

## 2015-08-12 LAB — RPR: RPR Ser Ql: NONREACTIVE

## 2015-08-15 LAB — COMPREHENSIVE DRUG ANALYSIS,UR: PDF: 0

## 2015-08-18 ENCOUNTER — Telehealth: Payer: Self-pay | Admitting: Neurology

## 2015-08-18 ENCOUNTER — Encounter: Payer: Self-pay | Admitting: *Deleted

## 2015-08-18 NOTE — Telephone Encounter (Signed)
Left message for a return call

## 2015-08-18 NOTE — Telephone Encounter (Signed)
Spoke to patient - she is aware of results and will start the B12 supplement.

## 2015-08-18 NOTE — Telephone Encounter (Signed)
Please call patient, laboratory showed low-normal B12 220, she needs over-the-counter supplement, rest of the laboratory evaluation was normal, Include CBC, ESR, thyroid function test

## 2015-08-18 NOTE — Telephone Encounter (Signed)
Patient returned your call. Please call. Thanks °

## 2015-09-18 ENCOUNTER — Ambulatory Visit: Payer: Medicare Other | Admitting: Neurology

## 2015-11-11 ENCOUNTER — Ambulatory Visit: Payer: Medicare Other | Admitting: Sports Medicine

## 2015-11-24 ENCOUNTER — Ambulatory Visit: Payer: Medicare Other | Admitting: Sports Medicine

## 2015-12-09 ENCOUNTER — Encounter: Payer: Self-pay | Admitting: Sports Medicine

## 2015-12-09 ENCOUNTER — Ambulatory Visit (INDEPENDENT_AMBULATORY_CARE_PROVIDER_SITE_OTHER): Payer: Medicare Other | Admitting: Sports Medicine

## 2015-12-09 DIAGNOSIS — B351 Tinea unguium: Secondary | ICD-10-CM | POA: Diagnosis not present

## 2015-12-09 DIAGNOSIS — M79676 Pain in unspecified toe(s): Secondary | ICD-10-CM

## 2015-12-09 NOTE — Progress Notes (Signed)
Patient ID: Lauren Thomas, female   DOB: 04-20-1935, 81 y.o.   MRN: [id number] Subjective: Lauren Thomas is a 81 y.o. female patient seen today in office with complaint of painful thickened and elongated toenails; unable to trim. Patient denies history of Diabetes, Neuropathy, or Vascular disease. Patient has no other pedal complaints at this time.   Patient Active Problem List   Diagnosis Date Noted  . Parkinsonism (HCC) 08/11/2015  . Abnormality of gait 08/11/2015  . Elevated troponin 10/12/2013  . Diabetes mellitus type 2 in obese (HCC) 10/12/2013  . Palpitations 10/12/2013  . Abnormal finding on EKG 10/12/2013  . LUNG NODULE- evaluated 2012 07/17/2010  . HYPERTENSION, BENIGN 01/06/2010  . OBESITY-MORBID 12/31/2009  . CAD, NATIVE VESSEL- mild CAD at cath 2002 and coronary CT 07/11 12/31/2009  . CHEST PAIN-UNSPECIFIED 12/31/2009    Current Outpatient Prescriptions on File Prior to Visit  Medication Sig Dispense Refill  . ALPRAZolam (XANAX) 0.25 MG tablet Take 0.25 mg by mouth 2 (two) times daily as needed for anxiety.    . carbidopa-levodopa (SINEMET) 25-100 MG tablet Take 1 tablet by mouth 3 (three) times daily. 90 tablet 6  . guaiFENesin (MUCINEX) 600 MG 12 hr tablet Take 600 mg by mouth 2 (two) times daily as needed for cough or to loosen phlegm.    . hydroxypropyl methylcellulose (ISOPTO TEARS) 2.5 % ophthalmic solution 1 drop. AS DIRECTED- ALSO KNOWN AS ARTIFICAL TEARS    . hyoscyamine (ANASPAZ) 0.125 MG TBDP disintergrating tablet Place 0.125 mg under the tongue every 6 (six) hours as needed for bladder spasms or cramping.    . Insulin Syringe-Needle U-100 (INSULIN SYRINGE 1CC/31GX5/16") 31G X 5/16" 1 ML MISC     . ONE TOUCH ULTRA TEST test strip     . PRESCRIPTION MEDICATION Vaginal cream/wash prescribed from  Urologist Patient did not know the name of it and family has not been able to find it at any pharmacies.    . vitamin B-12 (CYANOCOBALAMIN) 1000 MCG tablet Take 1,000  mcg by mouth daily.     No current facility-administered medications on file prior to visit.    Allergies  Allergen Reactions  . Erythromycin     Heart flutter  . Penicillins     unknown  . Red Dye   . Sulfonamide Derivatives     diarrhea  . Latex Rash  . Neosporin [Neomycin-Bacitracin Zn-Polymyx] Rash  . Polysporin [Bacitracin-Polymyxin B] Rash    Objective: Physical Exam  General: Well developed, nourished, no acute distress, awake, alert and oriented x 3  Vascular: Dorsalis pedis artery 2/4 bilateral, Posterior tibial artery 1/4 bilateral, skin temperature warm to warm proximal to distal bilateral lower extremities, mild varicosities, scant pedal hair present bilateral.  Neurological: Gross sensation present via light touch bilateral.   Dermatological: Skin is warm, dry, and supple bilateral, Nails 1-10 are tender, long, thick, and discolored with mild subungal debris, no webspace macerations present bilateral, no open lesions present bilateral, no callus/corns/hyperkeratotic tissue present bilateral. No signs of infection bilateral.  Musculoskeletal: No symptomatic boney deformities noted bilateral. Muscular strength within normal limits without painon range of motion. No pain with calf compression bilateral.  Assessment and Plan:  Problem List Items Addressed This Visit    None    Visit Diagnoses    Pain due to onychomycosis of toenail    -  Primary      -Examined patient.  -Discussed treatment options for painful mycotic nails. -Mechanically debrided and reduced mycotic nails  with sterile nail nipper and dremel nail file without incident. -Patient to return in 3 months for follow up evaluation or sooner if symptoms worsen.  [name], DPM

## 2015-12-30 ENCOUNTER — Telehealth: Payer: Self-pay | Admitting: Neurology

## 2015-12-30 NOTE — Telephone Encounter (Signed)
Spoke to patient's husband on HIPPA - he would like to come back in to see Dr. Terrace ArabiaYan.  Says his wife did not have the ordered MRI and has not been taking her medication or vitamin supplement.  An appt has been scheduled for her.

## 2015-12-30 NOTE — Telephone Encounter (Signed)
Pt's husband called in and would like to speak with the nurse about options for his wife. He was offered an appt but wanted to speak with nurse first. Please call and advise

## 2016-01-15 ENCOUNTER — Ambulatory Visit: Payer: Self-pay | Admitting: Neurology

## 2016-02-09 ENCOUNTER — Telehealth: Payer: Self-pay | Admitting: Neurology

## 2016-02-09 NOTE — Telephone Encounter (Signed)
Referral was place for Dr. Archer Asa - pt and husband aware - provided his office number.

## 2016-02-09 NOTE — Telephone Encounter (Signed)
Spouse called to get the name of Psychiatrist that Dr. Terrace Arabia recommended at last visit, please call [phone].

## 2016-03-16 ENCOUNTER — Telehealth: Payer: Self-pay | Admitting: Neurology

## 2016-03-16 NOTE — Telephone Encounter (Signed)
Patient's husband is calling. He states an appointment with Dr. Donell BeersPlovsky would not be until October and is wondering if we would be able to call and get her in sooner. The patient is hallucinating. Patient's husband can be reached at [phone] or [phone].

## 2016-03-16 NOTE — Telephone Encounter (Signed)
Called Dr. Caprice RenshawPlovsky's office (spoke to DwightLori) - he had a cancellation for 03/17/16 at 10am, arrival time of 9:30am.  They will need to call the registration line at [phone] today.  Spoke to pt's husband, who will call today.  He is aware of the appt time and location.

## 2016-03-22 ENCOUNTER — Ambulatory Visit: Payer: Medicare Other | Admitting: Sports Medicine

## 2016-03-23 ENCOUNTER — Ambulatory Visit: Payer: Medicare Other | Admitting: Sports Medicine

## 2016-04-12 ENCOUNTER — Other Ambulatory Visit: Payer: Self-pay | Admitting: *Deleted

## 2016-04-12 ENCOUNTER — Telehealth: Payer: Self-pay | Admitting: Neurology

## 2016-04-12 DIAGNOSIS — R269 Unspecified abnormalities of gait and mobility: Secondary | ICD-10-CM

## 2016-04-12 NOTE — Telephone Encounter (Signed)
Chart reviewed, I only saw her once in January 2017,  Yes she does need MRI of the brain, and follow-up visit after that

## 2016-04-12 NOTE — Telephone Encounter (Signed)
Pt's husband called to advise the pt has agreed to MRI. Please call

## 2016-04-12 NOTE — Telephone Encounter (Signed)
Orders updated - spoke to patient's husband, Skip Mayerddie , on HIPAA - he is aware to expect a call for scheduling.  Once her MRI is scheduled, he will call back to make the follow up appt.

## 2016-04-23 ENCOUNTER — Ambulatory Visit
Admission: RE | Admit: 2016-04-23 | Discharge: 2016-04-23 | Disposition: A | Discharge status: Home / Self Care | Payer: Medicare Other | Source: Ambulatory Visit | Attending: Neurology | Admitting: Neurology

## 2016-04-23 DIAGNOSIS — R269 Unspecified abnormalities of gait and mobility: Secondary | ICD-10-CM

## 2016-04-26 ENCOUNTER — Telehealth: Payer: Self-pay | Admitting: Neurology

## 2016-04-26 NOTE — Telephone Encounter (Signed)
Please call patient, MRI of brain showed age-related changes, slight worsening compared to previous MRI in 2010   IMPRESSION:  This MRI of the brain without contrast shows the following: 1.   Mild stable age-related chronic microvascular ischemic change. 2.   Mild cortical atrophy,  appropriate for age, that has progressed slightly when compared to the 09/25/2008 MRI. 3.   Single punctate chronic microhemorrhage in the mesial subcortical parietal lobe. In isolation, this is unlikely to be significant, though this appears to have occurred since 2010. 4.   Frontal skull thickening likely due to hyperostosis frontalis interna and stable when compared to the 2010 MRI.

## 2016-04-26 NOTE — Telephone Encounter (Signed)
Spoke to patient's husband (on HIPAA) - he is aware of results and they will keep her follow up for further discussion.

## 2016-04-27 ENCOUNTER — Ambulatory Visit (INDEPENDENT_AMBULATORY_CARE_PROVIDER_SITE_OTHER): Payer: Medicare Other | Admitting: Neurology

## 2016-04-27 ENCOUNTER — Encounter: Payer: Self-pay | Admitting: Neurology

## 2016-04-27 DIAGNOSIS — R269 Unspecified abnormalities of gait and mobility: Secondary | ICD-10-CM | POA: Diagnosis not present

## 2016-04-27 DIAGNOSIS — R413 Other amnesia: Secondary | ICD-10-CM

## 2016-04-27 DIAGNOSIS — G20 Parkinson's disease: Secondary | ICD-10-CM

## 2016-04-27 DIAGNOSIS — G3183 Dementia with Lewy bodies: Secondary | ICD-10-CM

## 2016-04-27 DIAGNOSIS — F0281 Dementia in other diseases classified elsewhere with behavioral disturbance: Secondary | ICD-10-CM | POA: Insufficient documentation

## 2016-04-27 MED ORDER — DONEPEZIL HCL 10 MG PO TABS: 10.0000 mg | ORAL_TABLET | Freq: Every day | ORAL | 11 refills | Status: DC

## 2016-04-27 MED ORDER — CARBIDOPA-LEVODOPA 25-100 MG PO TABS: 2.0000 | ORAL_TABLET | Freq: Three times a day (TID) | ORAL | 6 refills | Status: DC

## 2016-04-27 NOTE — Progress Notes (Signed)
PATIENT: Lauren Thomas DOB: 1935-01-02  Chief Complaint  Patient presents with  . Memory Loss    MMSE - animals.  She is here with her husband, Lauren Thomas and housemate, Lauren Thomas.  They would like to discuss her MRI results.  . Gait Problem    She has a slow, unsteady gait.  She uses a walker for assistance.       HISTORICAL  Lauren Thomas she is a 81 years old right-handed female, accompanied by her husband Lauren Thomas and her house mate Lauren Thomas, seen in refer by her primary care physician Dr. Chesley Thomas on Aug 11 2015 for evaluation of memory loss.  She had history of insulin dependent diabetes since 1997, macular degeneration, with poor vision, she could nolonge read newpaer, could not tell features of face.  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  She has no family history of dementia, she denies visual hallucinations  UPDATE Oct 17th 2017: Reviewed laboratory evaluation in 2017, low normal B12 222, normal CBC, ESR 20, thyroid panel, negative RPR,  We have personally reviewed MRI brain on Apr 23 2016: mild atrophy, supratentorium disease.  She complains of visual hallucinations, people at the backyard for yard sale, she woke up few times at night, she could not read any longer, her vision is bad, she could not watch TV, "I have to invent for something to be at corner".  She is mentally busy, calling people.    She is now taking Sinemet 25/100 mg 1 tablet 3 times a day, which help her walking is  on  REVIEW OF SYSTEMS: Full 14 system review of systems performed and notable only for loss of vision.  ALLERGIES: Allergies  Allergen Reactions  . Erythromycin     Heart flutter  . Penicillins     unknown  . Red Dye   . Sulfonamide Derivatives     diarrhea  . Latex Rash  . Neosporin [Neomycin-Bacitracin Zn-Polymyx] Rash  . Polysporin [Bacitracin-Polymyxin B] Rash    HOME MEDICATIONS: Current Outpatient Prescriptions  Medication Sig Dispense Refill  . losartan (COZAAR) 25 MG tablet daily.    . carbidopa-levodopa (SINEMET) 25-100 MG tablet Take 1 tablet by mouth 3 (three) times daily. 90 tablet 6  . diclofenac (VOLTAREN) 75 MG EC tablet as needed.  0  . guaiFENesin (MUCINEX) 600 MG 12 hr tablet Take 600 mg by mouth 2 (two) times daily as needed for cough or to loosen phlegm.    . hydroxypropyl methylcellulose (ISOPTO TEARS) 2.5 % ophthalmic solution 1 drop. AS DIRECTED- ALSO KNOWN AS ARTIFICAL TEARS    . hyoscyamine (ANASPAZ) 0.125 MG TBDP disintergrating tablet Place 0.125 mg under the tongue every 6 (six) hours as needed for bladder spasms or cramping.    . Insulin Syringe-Needle U-100 (INSULIN SYRINGE 1CC/31GX5/16") 31G X 5/16" 1 ML MISC     . LORazepam (ATIVAN) 0.5 MG tablet as needed.  3  . ONE TOUCH ULTRA TEST test strip     . PRESCRIPTION MEDICATION  Vaginal cream/wash prescribed from  Urologist Patient did not know the name of it and family has not been able to find it at any pharmacies.     No current facility-administered medications for this visit.     PAST MEDICAL HISTORY: Past Medical History:  Diagnosis Date  . Anxiety   . Arthritis   . Diabetes mellitus without complication ([place])   . Irregular heart rhythm   . Macular degeneration   . Memory loss     PAST SURGICAL HISTORY: Past Surgical History:  Procedure Laterality Date  . ABDOMINAL HYSTERECTOMY    . BACK SURGERY    . CATARACT EXTRACTION    . CHOLECYSTECTOMY    . LEFT HEART CATHETERIZATION WITH  CORONARY ANGIOGRAM N/A 10/15/2013   Procedure: LEFT HEART CATHETERIZATION WITH CORONARY ANGIOGRAM;  Surgeon: Lauren Grooms, MD;  Location: [facility] CATH LAB;  Service: Cardiovascular;  Laterality: N/A;    FAMILY HISTORY: Family History  Problem Relation Age of Onset  . Heart attack Mother   . Heart disease Mother   . Prostate cancer Father   . Diabetes Mother   . Diabetes Maternal Grandmother   . Heart disease Brother   . Lung cancer Brother     SOCIAL HISTORY:  Social History   Social History  . Marital status: Legally Separated    Spouse name: N/A  . Number of children: 3  . Years of education: BA   Occupational History  . Retired    Social History Main Topics  . Smoking status: Never Smoker  . Smokeless tobacco: Never Used  . Alcohol use No  . Drug use: No  . Sexual activity: Not on file   Other Topics Concern  . Not on file   Social History Narrative   Lives at home with her friend, [name].   Right-handed.   2-3 cups caffeine daily.     PHYSICAL EXAM   There were no vitals filed for this visit.  Not recorded      There is no height or weight on file to calculate BMI.  PHYSICAL EXAMNIATION:  Gen: NAD, conversant, well nourised, obese, well groomed                     Cardiovascular: Regular rate rhythm, no peripheral edema, warm, nontender. Eyes: Conjunctivae clear without exudates or hemorrhage Neck: Supple, no carotid bruise. Pulmonary: Clear to auscultation bilaterally   NEUROLOGICAL EXAM:  MENTAL STATUS: Speech:    Speech is normal; fluent and spontaneous with normal comprehension.  Cognition: Mini-Mental Status Examination is 28 of 30, animal naming 11    Orientation: She is not oriented to date.     Recent and remote memory: she missed 1/3 recall     Normal Attention span and concentration     Normal Language, naming, repeating,spontaneous speech     Fund of knowledge   CRANIAL NERVES: CN II: Visual fields are full to  confrontation. Pupils are round equal and sluggish reactive to light. Visual acuity finger counting both eyes CN III, IV, VI: extraocular movement are normal. No ptosis. CN V: Facial sensation is intact to pinprick in all 3 divisions bilaterally. Corneal responses are intact.  CN VII: Face is symmetric with normal eye closure and smile. CN VIII: Hearing is normal to rubbing fingers CN IX, X: Palate elevates symmetrically. Phonation is normal. CN XI: Head turning and shoulder shrug are intact CN XII: Tongue is midline with normal movements and no atrophy.  MOTOR: She has decreased facial expression, mild to moderate limb and nuchal rigidity, no weakness, bradykinesia  REFLEXES: Reflexes are 2+ and symmetric at the biceps, triceps, knees, and ankles. Plantar responses are flexor.  SENSORY: Intact to light touch, pinprick, position sense, and vibration sense are intact in fingers and toes.  COORDINATION: Rapid alternating movements and fine finger movements are intact. There is no dysmetria on finger-to-nose and heel-knee-shin.    GAIT/STANCE: She needs assistance from seated position, rely on her walker, difficult to initiate gait  DIAGNOSTIC DATA (LABS, IMAGING, TESTING) - I reviewed patient records, labs, notes, testing and imaging myself where available.   ASSESSMENT AND PLAN  CHRISTYN GUTKOWSKI is a 81 y.o. female   Mild cognitive impairment  Mini-Mental Status Examination is 28/30  MRI of the brain showed mild generalized atrophy supratentorium small vessel disease  Laboratory evaluation showed no treatable etiology, low normal B12, is on B12 supplements  Aricept 10 mg every day  Parkinsonian features  Differentiation diagnosis includes central nervous system degenerative disorder, dementia with parkinsonian features versus Lewy body dementia  Gait abnormality  Her walking has improved with Sinemet 25/100 mg, will increase the dose to 1 and half to 2 tablets 3 times a day, she  should take medicine at 8 AM, 12, 6 PM  Home physical therapy  Marcial Pacas, M.D. Ph.D.  [facility] [address], [place], [place] [address] Ph: [phone] Fax: [phone]  CC: Lauren Noon, MD

## 2016-04-30 ENCOUNTER — Emergency Department (HOSPITAL_COMMUNITY): Payer: Medicare Other

## 2016-04-30 ENCOUNTER — Encounter (HOSPITAL_COMMUNITY): Payer: Self-pay | Admitting: Emergency Medicine

## 2016-04-30 ENCOUNTER — Emergency Department (HOSPITAL_COMMUNITY)
Admission: EM | Admit: 2016-04-30 | Discharge: 2016-04-30 | Disposition: A | Discharge status: Home / Self Care | Payer: Medicare Other | Attending: Emergency Medicine | Admitting: Emergency Medicine

## 2016-04-30 DIAGNOSIS — E119 Type 2 diabetes mellitus without complications: Secondary | ICD-10-CM | POA: Insufficient documentation

## 2016-04-30 DIAGNOSIS — I10 Essential (primary) hypertension: Secondary | ICD-10-CM | POA: Insufficient documentation

## 2016-04-30 DIAGNOSIS — Z79899 Other long term (current) drug therapy: Secondary | ICD-10-CM | POA: Insufficient documentation

## 2016-04-30 DIAGNOSIS — R441 Visual hallucinations: Secondary | ICD-10-CM | POA: Diagnosis not present

## 2016-04-30 DIAGNOSIS — I2510 Atherosclerotic heart disease of native coronary artery without angina pectoris: Secondary | ICD-10-CM | POA: Insufficient documentation

## 2016-04-30 DIAGNOSIS — Z91040 Latex allergy status: Secondary | ICD-10-CM | POA: Diagnosis not present

## 2016-04-30 DIAGNOSIS — G20 Parkinson's disease: Secondary | ICD-10-CM | POA: Diagnosis not present

## 2016-04-30 LAB — RAPID URINE DRUG SCREEN, HOSP PERFORMED
Amphetamines: NOT DETECTED
Barbiturates: NOT DETECTED
Benzodiazepines: NOT DETECTED
COCAINE: NOT DETECTED
OPIATES: NOT DETECTED
TETRAHYDROCANNABINOL: NOT DETECTED

## 2016-04-30 LAB — COMPREHENSIVE METABOLIC PANEL
ALBUMIN: 3.5 g/dL (ref 3.5–5.0)
ALT: 8 U/L — ABNORMAL LOW (ref 14–54)
AST: 18 U/L (ref 15–41)
Alkaline Phosphatase: 65 U/L (ref 38–126)
Anion gap: 7 (ref 5–15)
BUN: 17 mg/dL (ref 6–20)
CO2: 22 mmol/L (ref 22–32)
Calcium: 8.7 mg/dL — ABNORMAL LOW (ref 8.9–10.3)
Chloride: 105 mmol/L (ref 101–111)
Creatinine, Ser: 0.93 mg/dL (ref 0.44–1.00)
GFR calc Af Amer: >60 mL/min (ref >60)
GFR calc non Af Amer: 57 mL/min — ABNORMAL LOW (ref >60)
Glucose, Bld: 119 mg/dL — ABNORMAL HIGH (ref 65–99)
POTASSIUM: 4.3 mmol/L (ref 3.5–5.1)
Sodium: 134 mmol/L — ABNORMAL LOW (ref 135–145)
Total Bilirubin: 0.7 mg/dL (ref 0.3–1.2)
Total Protein: 7.1 g/dL (ref 6.5–8.1)

## 2016-04-30 LAB — CBC WITH DIFFERENTIAL/PLATELET
BASOS PCT: 0 %
Basophils Absolute: 0 10*3/uL (ref 0.0–0.1)
EOS PCT: 1 %
Eosinophils Absolute: 0.1 10*3/uL (ref 0.0–0.7)
HCT: 39.3 % (ref 36.0–46.0)
Hemoglobin: 13.3 g/dL (ref 12.0–15.0)
Lymphocytes Relative: 22 %
Lymphs Abs: 2.1 10*3/uL (ref 0.7–4.0)
MCH: 30 pg (ref 26.0–34.0)
MCHC: 33.8 g/dL (ref 30.0–36.0)
MCV: 88.5 fL (ref 78.0–100.0)
MONO ABS: 0.6 10*3/uL (ref 0.1–1.0)
Monocytes Relative: 6 %
NEUTROS ABS: 6.5 10*3/uL (ref 1.7–7.7)
Neutrophils Relative %: 71 %
Platelets: 177 10*3/uL (ref 150–400)
RBC: 4.44 MIL/uL (ref 3.87–5.11)
RDW: 13.8 % (ref 11.5–15.5)
WBC: 9.3 10*3/uL (ref 4.0–10.5)

## 2016-04-30 LAB — URINE MICROSCOPIC-ADD ON

## 2016-04-30 LAB — URINALYSIS, ROUTINE W REFLEX MICROSCOPIC
BILIRUBIN URINE: NEGATIVE
Glucose, UA: NEGATIVE mg/dL
Hgb urine dipstick: NEGATIVE
Ketones, ur: NEGATIVE mg/dL
NITRITE: NEGATIVE
PROTEIN: NEGATIVE mg/dL
SPECIFIC GRAVITY, URINE: 1.01 (ref 1.005–1.030)
pH: 5.5 (ref 5.0–8.0)

## 2016-04-30 LAB — CBG MONITORING, ED: Glucose-Capillary: 134 mg/dL — ABNORMAL HIGH (ref 65–99)

## 2016-04-30 NOTE — ED Notes (Signed)
Bed: [id number] Expected date:  Expected time:  Means of arrival:  Comments: EMS  Elderly, hallucinations

## 2016-04-30 NOTE — Discharge Instructions (Signed)
You do not appear to have any infection today in the urine and chest. Her blood work overall looks reassuring as well.  Continue to follow up with your neurologist and psychiatrist regarding further workup and management of these hallucinations.  Return without fail for worsening symptoms, including confusion, fever, or any other symptoms concerning to you.

## 2016-04-30 NOTE — ED Triage Notes (Signed)
Pt comes to ed via ems, c/o pt has been having visions  seeing float people and strange things. Alert and oriented per ems, knows her name,  And year. Come from home, and housemate present in her room.   V/s on arrival on arrival bp 163/72, hr 72, cbg 138, rr16,  Temp 96.6 , spo2 99 roomair   Medical hx UTI,  Polypharmacy

## 2016-04-30 NOTE — ED Notes (Signed)
Patient ambulated with walker to hallway bathroom without difficulty.

## 2016-04-30 NOTE — ED Notes (Signed)
Patient transported to X-ray 

## 2016-04-30 NOTE — ED Notes (Signed)
Pt has been on parkinson disease medications and long term xanax. Pt does take a few psych related medications

## 2016-04-30 NOTE — ED Notes (Signed)
MD at bedside. 

## 2016-04-30 NOTE — ED Provider Notes (Signed)
[facility]-EM[facility]Y DEPT Provider Note   CSN: [id number] Arrival date & time: 04/30/16  0448     History   Chief Complaint Chief Complaint  Patient presents with  . Hallucinations    HPI Lauren Thomas is a 81 y.o. female.  HPI 81 year old female who presents with visual hallucinations. She has a history of diabetes, anxiety, and Parkinson's disease. States one month now where she wakes up in the middle of the night daily with visual hallucinations which she calls illusions. This morning at 3 AM states that she woke up and saw an old high school friend floating in the air around her. States that these hallucinations changes from night tonight. She has been following up with a neurologist and a psychiatrist regarding this issue. Has had a MRI earlier this week showing chronic ischemic changes in chronic punctate hemorrhage, but no acute intracranial processes. Her psychiatrist also has recently taken her off of Xanax onto Ativan for this without significant effect. Today she noted dysuria and urinary frequency, and was reminded that she has had UTIs in the past, and called EMS as she felt that this needed to be ruled out. Denies fevers, abdominal pain, nausea or vomiting, diarrhea, headaches, focal numbness or weakness, speech changes or confusion, cough or difficulty breathing.  Past Medical History:  Diagnosis Date  . Anxiety   . Arthritis   . Diabetes mellitus without complication (HCC)   . Irregular heart rhythm   . Macular degeneration   . Memory loss     Patient Active Problem List   Diagnosis Date Noted  . Memory loss 04/27/2016  . Parkinsonism (HCC) 08/11/2015  . Abnormality of gait 08/11/2015  . Elevated troponin 10/12/2013  . Diabetes mellitus type 2 in obese (HCC) 10/12/2013  . Palpitations 10/12/2013  . Abnormal finding on EKG 10/12/2013  . LUNG NODULE- evaluated 2012 07/17/2010  . HYPERTENSION, BENIGN 01/06/2010  . OBESITY-MORBID 12/31/2009  . CAD, NATIVE VESSEL-  mild CAD at cath 2002 and coronary CT 07/11 12/31/2009  . CHEST PAIN-UNSPECIFIED 12/31/2009    Past Surgical History:  Procedure Laterality Date  . ABDOMINAL HYSTERECTOMY    . BACK SURGERY    . CATARACT EXTRACTION    . CHOLECYSTECTOMY    . LEFT HEART CATHETERIZATION WITH CORONARY ANGIOGRAM N/A 10/15/2013   Procedure: LEFT HEART CATHETERIZATION WITH CORONARY ANGIOGRAM;  Surgeon: Lesleigh Noe, MD;  Location: [facility] CATH LAB;  Service: Cardiovascular;  Laterality: N/A;    OB History    No data available       Home Medications    Prior to Admission medications   Medication Sig Start Date End Date Taking? Authorizing Provider  carbidopa-levodopa (SINEMET) 25-100 MG tablet Take 2 tablets by mouth 3 (three) times daily. 04/27/16  Yes Levert Feinstein, MD  donepezil (ARICEPT) 10 MG tablet Take 1 tablet (10 mg total) by mouth at bedtime. 04/27/16  Yes Levert Feinstein, MD  diclofenac (VOLTAREN) 75 MG EC tablet as needed. 04/10/16   Historical Provider, MD  guaiFENesin (MUCINEX) 600 MG 12 hr tablet Take 600 mg by mouth 2 (two) times daily as needed for cough or to loosen phlegm.    Historical Provider, MD  hydroxypropyl methylcellulose (ISOPTO TEARS) 2.5 % ophthalmic solution 1 drop. AS DIRECTED- ALSO KNOWN AS ARTIFICAL TEARS    Historical Provider, MD  hyoscyamine (ANASPAZ) 0.125 MG TBDP disintergrating tablet Place 0.125 mg under the tongue every 6 (six) hours as needed for bladder spasms or cramping.    Historical Provider,  MD  LORazepam (ATIVAN) 0.5 MG tablet as needed. 04/14/16   Historical Provider, MD  losartan (COZAAR) 25 MG tablet daily. 04/23/16   Historical Provider, MD  PRESCRIPTION MEDICATION Vaginal cream/wash prescribed from  Urologist Patient did not know the name of it and family has not been able to find it at any pharmacies.    Historical Provider, MD    Family History Family History  Problem Relation Age of Onset  . Heart attack Mother   . Heart disease Mother   . Diabetes Mother     . Prostate cancer Father   . Diabetes Maternal Grandmother   . Heart disease Brother   . Lung cancer Brother     Social History Social History  Substance Use Topics  . Smoking status: Never Smoker  . Smokeless tobacco: Never Used  . Alcohol use No     Allergies   Erythromycin; Penicillins; Red dye; Sulfonamide derivatives; Latex; Neosporin [neomycin-bacitracin zn-polymyx]; and Polysporin [bacitracin-polymyxin b]   Review of Systems Review of Systems 10/14 systems reviewed and are negative other than those stated in the HPI   Physical Exam Updated Vital Signs BP 163/73   Pulse 90   Temp 97.6 F (36.4 C) (Oral)   Resp 16   SpO2 93%   Physical Exam Physical Exam  Nursing note and vitals reviewed. Constitutional: Well developed, well nourished, non-toxic, and in no acute distress Head: Normocephalic and atraumatic. Eyes: PERRL, EOMI  Mouth/Throat: Oropharynx is clear and moist.  Neck: Normal range of motion. Neck supple.  Cardiovascular: Normal rate and regular rhythm.   Pulmonary/Chest: Effort normal and breath sounds normal.  Abdominal: Soft. There is no tenderness. There is no rebound and no guarding.  Musculoskeletal: Normal range of motion.  Neurological: Alert, no facial droop, fluent speech, moves all extremities symmetrically, sensation to light touch in tact throughout Skin: Skin is warm and dry.  Psychiatric: Cooperative   ED Treatments / Results  Labs (all labs ordered are listed, but only abnormal results are displayed) Labs Reviewed  COMPREHENSIVE METABOLIC PANEL - Abnormal; Notable for the following:       Result Value   Sodium 134 (*)    Glucose, Bld 119 (*)    Calcium 8.7 (*)    ALT 8 (*)    GFR calc non Af Amer 57 (*)    All other components within normal limits  URINALYSIS, ROUTINE W REFLEX MICROSCOPIC (NOT AT [facility]) - Abnormal; Notable for the following:    APPearance CLOUDY (*)    Leukocytes, UA TRACE (*)    All other components within  normal limits  URINE MICROSCOPIC-ADD ON - Abnormal; Notable for the following:    Squamous Epithelial / LPF 0-5 (*)    Bacteria, UA RARE (*)    All other components within normal limits  CBG MONITORING, ED - Abnormal; Notable for the following:    Glucose-Capillary 134 (*)    All other components within normal limits  URINE CULTURE  CBC WITH DIFFERENTIAL/PLATELET  RAPID URINE DRUG SCREEN, HOSP PERFORMED    EKG  EKG Interpretation None       Radiology Dg Chest 2 View  Result Date: 04/30/2016 CLINICAL DATA:  Hallucinations EXAM: CHEST  2 VIEW COMPARISON:  10/12/2013 FINDINGS: The heart size and mediastinal contours are within normal limits. Both lungs are clear. The visualized skeletal structures are unremarkable. IMPRESSION: No active cardiopulmonary disease. Electronically Signed   By: Alcide CleverMark  Lukens M.D.   On: 04/30/2016 08:03    Procedures  Procedures (including critical care time)  Medications Ordered in ED Medications - No data to display   Initial Impression / Assessment and Plan / ED Course  I have reviewed the triage vital signs and the nursing notes.  Pertinent labs & imaging results that were available during my care of the patient were reviewed by me and considered in my medical decision making (see chart for details).  Clinical Course    81 year old female who presents with visual hallucinations during the nighttime   for over one month. Has been getting this worked up through an outpatient neurologist and psychiatrist. She currently is well-appearing, in no acute distress, with stable vital signs. Is alert, oriented, and neuro intact. Does not appear to be delirium, and she states she is not confused or not witnessed to be confused or have waxing and waning mental status changes. She recently had an MRI this week that does not show any acute changes in the brain, I do not think she needs any further neuro imaging here in the ED. Basic blood work shows no major  electrolyte or metabolic derangements. Her UA does not show clear infection, but we will send for culture. Chest x-ray is visualized and shows no acute cardiopulmonary processes. At this time she is at her baseline, and feels well. I think that she is stable for continued outpatient treatment and management by her neurologist and psychiatrist.  The patient appears reasonably screened and/or stabilized for discharge and I doubt any other medical condition or other [name] requiring further screening, evaluation, or treatment in the ED at this time prior to discharge.  Strict return and follow-up instructions reviewed. She expressed understanding of all discharge instructions and felt comfortable with the plan of care.  Final Clinical Impressions(s) / ED Diagnoses   Final diagnoses:  Visual hallucinations    New Prescriptions New Prescriptions   No medications on file     Lavera Guise, MD 04/30/16 [phone]

## 2016-04-30 NOTE — ED Notes (Signed)
Patient has verbally requested that she is NOT to receive xanax.

## 2016-05-01 LAB — URINE CULTURE: Culture: 60000 — AB

## 2016-05-02 ENCOUNTER — Telehealth (HOSPITAL_BASED_OUTPATIENT_CLINIC_OR_DEPARTMENT_OTHER): Payer: Self-pay

## 2016-05-02 NOTE — Progress Notes (Signed)
ED Antimicrobial Stewardship Positive Culture Follow Up   Lauren Thomas is an 81 y.o. female who [facility] with a chief complaint of  Chief Complaint  Patient presents with  . Hallucinations    Recent Results (from the past 720 hour(s))  Urine culture     Status: Abnormal   Collection Time: 04/30/16  7:24 AM  Result Value Ref Range Status   Specimen Description URINE, CLEAN CATCH  Final   Special Requests NONE  Final   Culture (A)  Final    60,000 COLONIES/mL GROUP B STREP(S.AGALACTIAE)ISOLATED TESTING AGAINST S. AGALACTIAE NOT ROUTINELY PERFORMED DUE TO PREDICTABILITY OF AMP/PEN/VAN SUSCEPTIBILITY. Performed at [facility]    Report Status 05/01/2016 FINAL  Final   Came in with hallucinations. Only 60k colonies of GBS. Will not treat. UA neg  ED Provider: Otila KluverLawyer, [place]  Ulyses SouthwardMinh Kathya Wilz, PharmD Pager: [phone] Infectious Diseases Pharmacist Phone# [phone]

## 2016-05-02 NOTE — Telephone Encounter (Signed)
Post ED Visit - Positive Culture Follow-up  Culture report reviewed by antimicrobial stewardship pharmacist:  []  Lauren Thomas, Pharm.D. []  Lauren Thomas, Pharm.D., BCPS []  Lauren Thomas, Pharm.D. []  Lauren Thomas, Pharm.D., BCPS [x]  Lauren Thomas, [address] BCPS, AAHIVP []  Lauren Thomas, Pharm.D., BCPS, AAHIVP []  Lauren Thomas, Pharm.D. []  Lauren Thomas, [address]Pharm[address]  Positive urine culture  and no further patient follow-up is required at this time.  Lauren Thomas, Lauren Thomas 05/02/2016, 11:45 AM

## 2016-05-12 ENCOUNTER — Telehealth: Payer: Self-pay | Admitting: Neurology

## 2016-05-12 NOTE — Telephone Encounter (Signed)
Pt's [facility] called in [facility]. Can she be sent to different place? Please call and update the pt. Thank you.

## 2016-05-13 ENCOUNTER — Encounter (HOSPITAL_COMMUNITY): Payer: Self-pay | Admitting: *Deleted

## 2016-05-13 ENCOUNTER — Emergency Department (HOSPITAL_COMMUNITY)
Admission: EM | Admit: 2016-05-13 | Discharge: 2016-05-14 | Disposition: A | Discharge status: Home / Self Care | Payer: Medicare Other | Attending: Emergency Medicine | Admitting: Emergency Medicine

## 2016-05-13 ENCOUNTER — Emergency Department (HOSPITAL_COMMUNITY): Payer: Medicare Other

## 2016-05-13 DIAGNOSIS — I2510 Atherosclerotic heart disease of native coronary artery without angina pectoris: Secondary | ICD-10-CM | POA: Diagnosis not present

## 2016-05-13 DIAGNOSIS — F419 Anxiety disorder, unspecified: Secondary | ICD-10-CM | POA: Diagnosis not present

## 2016-05-13 DIAGNOSIS — G20 Parkinson's disease: Secondary | ICD-10-CM | POA: Insufficient documentation

## 2016-05-13 DIAGNOSIS — R443 Hallucinations, unspecified: Secondary | ICD-10-CM

## 2016-05-13 DIAGNOSIS — Z91040 Latex allergy status: Secondary | ICD-10-CM | POA: Diagnosis not present

## 2016-05-13 DIAGNOSIS — E119 Type 2 diabetes mellitus without complications: Secondary | ICD-10-CM | POA: Insufficient documentation

## 2016-05-13 DIAGNOSIS — R441 Visual hallucinations: Secondary | ICD-10-CM

## 2016-05-13 DIAGNOSIS — Z955 Presence of coronary angioplasty implant and graft: Secondary | ICD-10-CM | POA: Diagnosis not present

## 2016-05-13 DIAGNOSIS — Z79899 Other long term (current) drug therapy: Secondary | ICD-10-CM | POA: Insufficient documentation

## 2016-05-13 DIAGNOSIS — Z8249 Family history of ischemic heart disease and other diseases of the circulatory system: Secondary | ICD-10-CM | POA: Diagnosis not present

## 2016-05-13 DIAGNOSIS — Z833 Family history of diabetes mellitus: Secondary | ICD-10-CM | POA: Diagnosis not present

## 2016-05-13 LAB — COMPREHENSIVE METABOLIC PANEL
ALK PHOS: 65 U/L (ref 38–126)
ALT: 6 U/L — AB (ref 14–54)
AST: 21 U/L (ref 15–41)
Albumin: 3.8 g/dL (ref 3.5–5.0)
Anion gap: 8 (ref 5–15)
BUN: 17 mg/dL (ref 6–20)
CALCIUM: 8.9 mg/dL (ref 8.9–10.3)
CHLORIDE: 102 mmol/L (ref 101–111)
CO2: 25 mmol/L (ref 22–32)
CREATININE: 1.15 mg/dL — AB (ref 0.44–1.00)
GFR, EST AFRICAN AMERICAN: 51 mL/min — AB (ref >60)
GFR, EST NON AFRICAN AMERICAN: 44 mL/min — AB (ref >60)
Glucose, Bld: 114 mg/dL — ABNORMAL HIGH (ref 65–99)
Potassium: 4.4 mmol/L (ref 3.5–5.1)
Sodium: 135 mmol/L (ref 135–145)
Total Bilirubin: 0.7 mg/dL (ref 0.3–1.2)
Total Protein: 7.4 g/dL (ref 6.5–8.1)

## 2016-05-13 LAB — URINALYSIS, ROUTINE W REFLEX MICROSCOPIC
Bilirubin Urine: NEGATIVE
GLUCOSE, UA: NEGATIVE mg/dL
HGB URINE DIPSTICK: NEGATIVE
Ketones, ur: NEGATIVE mg/dL
Nitrite: NEGATIVE
Protein, ur: NEGATIVE mg/dL
SPECIFIC GRAVITY, URINE: 1.011 (ref 1.005–1.030)
pH: 5.5 (ref 5.0–8.0)

## 2016-05-13 LAB — URINE MICROSCOPIC-ADD ON: RBC / HPF: NONE SEEN RBC/hpf (ref 0–5)

## 2016-05-13 LAB — RAPID URINE DRUG SCREEN, HOSP PERFORMED
Amphetamines: NOT DETECTED
BENZODIAZEPINES: POSITIVE — AB
Barbiturates: NOT DETECTED
COCAINE: NOT DETECTED
OPIATES: NOT DETECTED
Tetrahydrocannabinol: NOT DETECTED

## 2016-05-13 LAB — CBC
HCT: 39.7 % (ref 36.0–46.0)
Hemoglobin: 13.5 g/dL (ref 12.0–15.0)
MCH: 30.3 pg (ref 26.0–34.0)
MCHC: 34 g/dL (ref 30.0–36.0)
MCV: 89 fL (ref 78.0–100.0)
PLATELETS: 203 10*3/uL (ref 150–400)
RBC: 4.46 MIL/uL (ref 3.87–5.11)
RDW: 13.8 % (ref 11.5–15.5)
WBC: 9.5 10*3/uL (ref 4.0–10.5)

## 2016-05-13 LAB — ETHANOL

## 2016-05-13 NOTE — ED Notes (Signed)
Pt has in belonging bag:  Dark blue dress, and black sandals

## 2016-05-13 NOTE — ED Notes (Signed)
Bed: [facility] Expected date:  Expected time:  Means of arrival:  Comments: 

## 2016-05-13 NOTE — Progress Notes (Signed)
Will inform day EDCM to follow up with patient in the am.

## 2016-05-13 NOTE — ED Provider Notes (Signed)
[facility]-EMERG[facility]Y DEPT Provider Note   CSN: [id number] Arrival date & time: 05/13/16  1956     History   Chief Complaint Chief Complaint  Patient presents with  . Hallucinations    HPI  Lauren Thomas is a 81 y.o. female who presents with visual hallucinations. PMH hx significant for Parkinsonism, gait abnormality, memory loss, DM, CAD, hx of visual hallucinations. She states Dr. Katha HammingPlotsky is her psychiatrist. He called her earlier today and told her he was going to start her on a new medicine to help with the hallucinations. She last saw neurology on 10/17 and had a MRI on 10/13 which showed mild atrophy, supratentorium disease, Single punctate chronic microhemorrhage in the mesial subcortical parietal lobe. She states the visual hallucinations are relatively new and started 3 months ago. Usually the people are outside, peering in from the windows. Today she woke up and got scared because they were in her room. She states she has been more anxious than usual because of the hallucinations and feels unsafe at home. She states they do not speak to her or they "speak another language". Denies auditory hallucinations. Denies SI/HI. Denies fever, HA, chest pain, SOB, abdominal pain, N/V, dysuria.  She also reports a fall which occurred 4 days ago. She is ambulatory with a walker normally. She states she was "running" to answer the phone in her home and tripped and fell on to her buttocks. She reports mild pain over her buttocks. Denies any other pain complaints. Denies bowel/bladder incontinence, numbness, tingling, saddle anesthesia.  HPI  Past Medical History:  Diagnosis Date  . Anxiety   . Arthritis   . Diabetes mellitus without complication (HCC)   . Irregular heart rhythm   . Macular degeneration   . Memory loss     Patient Active Problem List   Diagnosis Date Noted  . Memory loss 04/27/2016  . Parkinsonism (HCC) 08/11/2015  . Abnormality of gait 08/11/2015  . Elevated troponin  10/12/2013  . Diabetes mellitus type 2 in obese (HCC) 10/12/2013  . Palpitations 10/12/2013  . Abnormal finding on EKG 10/12/2013  . LUNG NODULE- evaluated 2012 07/17/2010  . HYPERTENSION, BENIGN 01/06/2010  . OBESITY-MORBID 12/31/2009  . CAD, NATIVE VESSEL- mild CAD at cath 2002 and coronary CT 07/11 12/31/2009  . CHEST PAIN-UNSPECIFIED 12/31/2009    Past Surgical History:  Procedure Laterality Date  . ABDOMINAL HYSTERECTOMY    . BACK SURGERY    . CATARACT EXTRACTION    . CHOLECYSTECTOMY    . LEFT HEART CATHETERIZATION WITH CORONARY ANGIOGRAM N/A 10/15/2013   Procedure: LEFT HEART CATHETERIZATION WITH CORONARY ANGIOGRAM;  Surgeon: Lesleigh NoeHenry W Smith III, MD;  [facility];  Service: Cardiovascular;  Laterality: N/A;    OB History    No data available       Home Medications    Prior to Admission medications   Medication Sig Start Date End Date Taking? Authorizing Provider  carbidopa-levodopa (SINEMET) 25-100 MG tablet Take 2 tablets by mouth 3 (three) times daily. 04/27/16  Yes Levert FeinsteinYijun Yan, MD  diclofenac (VOLTAREN) 75 MG EC tablet Take 75 mg by mouth 2 (two) times daily.  04/10/16  Yes Historical Provider, MD  donepezil (ARICEPT) 10 MG tablet Take 1 tablet (10 mg total) by mouth at bedtime. 04/27/16  Yes Levert FeinsteinYijun Yan, MD  guaiFENesin (MUCINEX) 600 MG 12 hr tablet Take 600 mg by mouth 2 (two) times daily as needed for cough or to loosen phlegm.   Yes Historical Provider, MD  hydroxypropyl methylcellulose (ISOPTO TEARS) 2.5 % ophthalmic solution 1 drop. AS DIRECTED- ALSO KNOWN AS ARTIFICAL TEARS   Yes Historical Provider, MD  hyoscyamine (ANASPAZ) 0.125 MG TBDP disintergrating tablet Place 0.125 mg under the tongue every 6 (six) hours as needed for bladder spasms or cramping.   Yes Historical Provider, MD  LORazepam (ATIVAN) 0.5 MG tablet Take 0.5 mg by mouth as needed for anxiety.  04/14/16  Yes Historical Provider, MD  losartan (COZAAR) 25 MG tablet Take 25 mg by mouth daily.   04/23/16  Yes Historical Provider, MD  PRESCRIPTION MEDICATION Vaginal cream/wash prescribed from  Urologist Patient did not know the name of it and family has not been able to find it at any pharmacies.   Yes Historical Provider, MD    Family History Family History  Problem Relation Age of Onset  . Heart attack Mother   . Heart disease Mother   . Diabetes Mother   . Prostate cancer Father   . Diabetes Maternal Grandmother   . Heart disease Brother   . Lung cancer Brother     Social History Social History  Substance Use Topics  . Smoking status: Never Smoker  . Smokeless tobacco: Never Used  . Alcohol use No     Allergies   Erythromycin; Penicillins; Red dye; Sulfonamide derivatives; Latex; Neosporin [neomycin-bacitracin zn-polymyx]; and Polysporin [bacitracin-polymyxin b]   Review of Systems Review of Systems  Constitutional: Negative for fever.  Respiratory: Negative for shortness of breath.   Cardiovascular: Negative for chest pain.  Gastrointestinal: Negative for abdominal pain, nausea and vomiting.  Genitourinary: Negative for dysuria.  Musculoskeletal:       "Buttocks pain"  Neurological: Negative for headaches.  Psychiatric/Behavioral: Positive for hallucinations. Negative for agitation, behavioral problems, confusion, dysphoric mood, self-injury and suicidal ideas. The patient is not nervous/anxious.   All other systems reviewed and are negative.    Physical Exam Updated Vital Signs BP 132/82 (BP Location: Left Arm)   Pulse 78   Temp 98.7 F (37.1 C) (Oral)   Resp 18   SpO2 95%   Physical Exam  Constitutional: She is oriented to person, place, and time. She appears well-developed and well-nourished. No distress.  NAD, obese, pleasant  HENT:  Head: Normocephalic and atraumatic.  Eyes: Conjunctivae are normal. Pupils are equal, round, and reactive to light. Right eye exhibits no discharge. Left eye exhibits no discharge. No scleral icterus.  Neck:  Normal range of motion. Neck supple.  Cardiovascular: Normal rate and regular rhythm.  Exam reveals no gallop and no friction rub.   No murmur heard. Pulmonary/Chest: Effort normal and breath sounds normal. No respiratory distress. She has no wheezes. She has no rales. She exhibits no tenderness.  Abdominal: Soft. Bowel sounds are normal. She exhibits no distension and no mass. There is no tenderness. There is no rebound and no guarding. No hernia.  Musculoskeletal: She exhibits no edema.  Tenderness of lower back  Neurological: She is alert and oriented to person, place, and time.  Able to stand but not ambulate far  Skin: Skin is warm and dry.  Psychiatric: She has a normal mood and affect. Her behavior is normal.  Nursing note and vitals reviewed.    ED Treatments / Results  Labs (all labs ordered are listed, but only abnormal results are displayed) Labs Reviewed  COMPREHENSIVE METABOLIC PANEL - Abnormal; Notable for the following:       Result Value   Glucose, Bld 114 (*)    Creatinine, Ser  1.15 (*)    ALT 6 (*)    GFR calc non Af Amer 44 (*)    GFR calc Af Amer 51 (*)    All other components within normal limits  RAPID URINE DRUG SCREEN, HOSP PERFORMED - Abnormal; Notable for the following:    Benzodiazepines POSITIVE (*)    All other components within normal limits  URINALYSIS, ROUTINE W REFLEX MICROSCOPIC (NOT AT [facility]) - Abnormal; Notable for the following:    APPearance CLOUDY (*)    Leukocytes, UA LARGE (*)    All other components within normal limits  URINE MICROSCOPIC-ADD ON - Abnormal; Notable for the following:    Squamous Epithelial / LPF 0-5 (*)    Bacteria, UA FEW (*)    All other components within normal limits  ETHANOL  CBC    EKG  EKG Interpretation None       Radiology Dg Sacrum/coccyx  Result Date: 05/14/2016 CLINICAL DATA:  Pain after falling 5 days ago. EXAM: SACRUM AND COCCYX - 2+ VIEW COMPARISON:  None. FINDINGS: There is no evidence of  fracture or other focal bone lesions. Pubic symphysis and sacroiliac joints appear intact. IMPRESSION: Negative. Electronically Signed   By: Ellery Plunkaniel R Mitchell M.D.   On: 05/14/2016 00:04    Procedures Procedures (including critical care time)  Medications Ordered in ED Medications - No data to display   Initial Impression / Assessment and Plan / ED Course  I have reviewed the triage vital signs and the nursing notes.  Pertinent labs & imaging results that were available during my care of the patient were reviewed by me and considered in my medical decision making (see chart for details).  Clinical Course   81 year old female presents with visual hallucinations which have been increasing in severity. She is also somewhat confused but can give clear history at times. Patient is afebrile, not tachycardic or tachypneic, normotensive, and not hypoxic. CBC unremarkable. CMP remarkable for mild increase in SCr. Glucose is 114. UDS positive for benzos which she is prescribed. ETOH <5. UA remarkable for few bacteria, large leukocytes, 6-30 WBC. Patient denies urinary symptoms. Patient reports a mechanical fall on to her buttocks. Xray negative. Patient is medically cleared. Awaiting TTS consult.  Final Clinical Impressions(s) / ED Diagnoses   Final diagnoses:  Visual hallucinations    New Prescriptions New Prescriptions   No medications on file     Bethel BornKelly Marie Marcelles Clinard, PA-C 05/14/16 0109    Laurence Spatesachel Morgan Little, MD 05/14/16 [phone]

## 2016-05-13 NOTE — Progress Notes (Signed)
EDCM spoke to patient at bedside.  Patient reports she lives at home with her "house mate" [name].  She rpeorts her husband dies three years ago.  She reports she is usually able to perform her ADL's on her own.  She reports she ambulates with a walker at home.  She reports she ambulates with a walker at home.  She reports she [place] [facility] who comes in once a week to fill her pill box for her.  She reports she is about to start physical therapy at home.  She reports sh is legally blind and has not driven in three years.  She reports she saw her pcp Dr. Cyndia BentBadger "on Tuesday Halloween."  [facility] asked patient how she received her meals at home.  She reports [place] "[place] to [place], a [employer] and a [place]."   Patient reports she fell for the first time on Sunday, "On my butt bone."    EDCM informed EDPA Tresa EndoKelly.  Informed EDPA of above.  Patient presents to ED with hallucinations which are not new.  Patient has psychiatrist and neurologist as out patient.   Patient to be assessed by TTS.  Awaiting disposition.

## 2016-05-13 NOTE — ED Triage Notes (Signed)
Per EMS, pt complains of visual hallucinations for the past hour. Pt states she sees people who are not there. Pt has hx of hallucinations. Pt denies SI/HI

## 2016-05-13 NOTE — ED Notes (Signed)
With social work interview

## 2016-05-14 DIAGNOSIS — R443 Hallucinations, unspecified: Secondary | ICD-10-CM | POA: Diagnosis not present

## 2016-05-14 DIAGNOSIS — Z8249 Family history of ischemic heart disease and other diseases of the circulatory system: Secondary | ICD-10-CM

## 2016-05-14 DIAGNOSIS — Z79899 Other long term (current) drug therapy: Secondary | ICD-10-CM

## 2016-05-14 DIAGNOSIS — Z888 Allergy status to other drugs, medicaments and biological substances status: Secondary | ICD-10-CM

## 2016-05-14 DIAGNOSIS — Z91040 Latex allergy status: Secondary | ICD-10-CM

## 2016-05-14 DIAGNOSIS — Z8042 Family history of malignant neoplasm of prostate: Secondary | ICD-10-CM

## 2016-05-14 DIAGNOSIS — F419 Anxiety disorder, unspecified: Secondary | ICD-10-CM | POA: Diagnosis not present

## 2016-05-14 DIAGNOSIS — Z801 Family history of malignant neoplasm of trachea, bronchus and lung: Secondary | ICD-10-CM

## 2016-05-14 DIAGNOSIS — Z833 Family history of diabetes mellitus: Secondary | ICD-10-CM | POA: Diagnosis not present

## 2016-05-14 LAB — CBG MONITORING, ED: Glucose-Capillary: 173 mg/dL — ABNORMAL HIGH (ref 65–99)

## 2016-05-14 MED ORDER — LORAZEPAM 1 MG PO TABS: 1.0000 mg | ORAL_TABLET | Freq: Three times a day (TID) | ORAL | Status: DC | PRN

## 2016-05-14 MED ORDER — ACETAMINOPHEN 325 MG PO TABS: 650.0000 mg | ORAL_TABLET | ORAL | Status: DC | PRN

## 2016-05-14 MED ORDER — ONDANSETRON HCL 4 MG PO TABS: 4.0000 mg | ORAL_TABLET | Freq: Three times a day (TID) | ORAL | Status: DC | PRN

## 2016-05-14 MED ORDER — CARBIDOPA-LEVODOPA 25-100 MG PO TABS
2.0000 | ORAL_TABLET | Freq: Three times a day (TID) | ORAL | Status: DC
  Administered 2016-05-14: 2 via ORAL
  Filled 2016-05-14 (×2): qty 2

## 2016-05-14 MED ORDER — LOSARTAN POTASSIUM 25 MG PO TABS
25.0000 mg | ORAL_TABLET | Freq: Every day | ORAL | Status: DC
  Administered 2016-05-14: 25 mg via ORAL
  Filled 2016-05-14: qty 1

## 2016-05-14 MED ORDER — HYOSCYAMINE SULFATE 0.125 MG PO TBDP
0.1250 mg | ORAL_TABLET | Freq: Four times a day (QID) | ORAL | Status: DC | PRN
  Filled 2016-05-14: qty 1

## 2016-05-14 MED ORDER — DONEPEZIL HCL 5 MG PO TABS: 10.0000 mg | ORAL_TABLET | Freq: Every day | ORAL | Status: DC

## 2016-05-14 MED ORDER — NICOTINE 21 MG/24HR TD PT24: 21.0000 mg | MEDICATED_PATCH | Freq: Every day | TRANSDERMAL | Status: DC

## 2016-05-14 NOTE — Progress Notes (Signed)
Spoke with Caryn Beeim of [facility] to see what services pt has Pending

## 2016-05-14 NOTE — BH Assessment (Signed)
Tele Assessment Note   Lauren Thomas is an 81 y.o. female who presents to the ED brought in by EMS due to experiencing visual hallucinations. Pt reports she has been seeing people walking around on her back porch and she knows they are not really there. Pt reports the hallucinations began about 3 or 4 weeks ago and they do not speak to her, they only walk around. Pt reports the people appear to be dressed in regular clothing and do not appear to be threatening. Pt reports she has a past history of depression and currently takes medication due to depressive thoughts.  Pt appeared to be pleasant during the assessment as she was smiling and laughing with the assessor. Pt began to talk about her best friend of 65 years who is having a birthday party at a church this Sunday and she wants to be present for her friends birthday. Pt denies H/I and reports she has never thought of suicide and never experienced issues with S/I .   Per Nira ConnJason Berry, FNP pt will need an AM psych eval. Jon, RN has been notified of the recommended disposition.    Diagnosis: Psychosis   Past Medical History:  Past Medical History:  Diagnosis Date   Anxiety    Arthritis    Diabetes mellitus without complication (HCC)    Irregular heart rhythm    Macular degeneration    Memory loss     Past Surgical History:  Procedure Laterality Date   ABDOMINAL HYSTERECTOMY     BACK SURGERY     CATARACT EXTRACTION     CHOLECYSTECTOMY     LEFT HEART CATHETERIZATION WITH CORONARY ANGIOGRAM N/A 10/15/2013   Procedure: LEFT HEART CATHETERIZATION WITH CORONARY ANGIOGRAM;  Surgeon: Lesleigh NoeHenry W Smith III, MD;  [facility];  Service: Cardiovascular;  Laterality: N/A;    Family History:  Family History  Problem Relation Age of Onset   Heart attack Mother    Heart disease Mother    Diabetes Mother    Prostate cancer Father    Diabetes Maternal Grandmother    Heart disease Brother    Lung cancer Brother      Social History:  reports that she has never smoked. She has never used smokeless tobacco. She reports that she does not drink alcohol or use drugs.  Additional Social History:  Alcohol / Drug Use Pain Medications: Pt denies abuse  Prescriptions: Pt denies abuse  Over the Counter: Pt denies abuse  History of alcohol / drug use?: No history of alcohol / drug abuse  CIWA: CIWA-Ar BP: 95/62 Pulse Rate: 80 COWS:    PATIENT STRENGTHS: (choose at least two) Average or above average intelligence Communication skills Motivation for treatment/growth Supportive family/friends  Allergies:  Allergies  Allergen Reactions   Erythromycin     Heart flutter   Penicillins     Patient has no idea what her reaction is    Red Dye    Sulfonamide Derivatives     diarrhea   Latex Rash   Neosporin [Neomycin-Bacitracin Zn-Polymyx] Rash   Polysporin [Bacitracin-Polymyxin B] Rash    Home Medications:  (Not in a hospital admission)  OB/GYN Status:  No LMP recorded. Patient has had a hysterectomy.  General Assessment Data Location of Assessment: [facility] TTS Assessment: In system Is this a Tele or Face-to-Face Assessment?: Face-to-Face Is this an Initial Assessment or a Re-assessment for this encounter?: Initial Assessment Marital status: Separated Is patient pregnant?: No Pregnancy Status: No Living Arrangements:  Other (Comment) (with roommate) Can pt return to current living arrangement?: Yes Admission Status: Voluntary Is patient capable of signing voluntary admission?: Yes Referral Source: Self/Family/Friend Insurance type: [employer] medicare     Crisis Care Plan Living Arrangements: Other (Comment) (with roommate) Name of Psychiatrist: Dr. Archer AsaGerald Plovsky  Education Status Is patient currently in school?: No Highest grade of school patient has completed: some Graduate school  Risk to self with the past 6 months Suicidal Ideation: No Has patient been a risk to  self within the past 6 months prior to admission? : No Suicidal Intent: No Has patient had any suicidal intent within the past 6 months prior to admission? : No Is patient at risk for suicide?: No Suicidal Plan?: No Has patient had any suicidal plan within the past 6 months prior to admission? : No Access to Means: No What has been your use of drugs/alcohol within the last 12 months?: denies Previous Attempts/Gestures: No Triggers for Past Attempts: None known Intentional Self Injurious Behavior: None Family Suicide History: No Recent stressful life event(s): Other (Comment) (reports to experiencing visual hallucinations) Persecutory voices/beliefs?: No Depression: No Substance abuse history and/or treatment for substance abuse?: No Suicide prevention information given to non-admitted patients: Not applicable  Risk to Others within the past 6 months Homicidal Ideation: No Does patient have any lifetime risk of violence toward others beyond the six months prior to admission? : No Thoughts of Harm to Others: No Current Homicidal Intent: No Current Homicidal Plan: No Access to Homicidal Means: No History of harm to others?: No Assessment of Violence: None Noted Does patient have access to weapons?: No Criminal Charges Pending?: No Does patient have a court date: No Is patient on probation?: No  Psychosis Hallucinations: Visual Delusions: None noted  Mental Status Report Appearance/Hygiene: In scrubs Eye Contact: Good Motor Activity: Freedom of movement Speech: Logical/coherent Level of Consciousness: Alert Mood: Anxious, Helpless Affect: Anxious Anxiety Level: Moderate Thought Processes: Coherent, Relevant Judgement: Unimpaired Orientation: Person, Place, Situation, Time, Appropriate for developmental age Obsessive Compulsive Thoughts/Behaviors: None  Cognitive Functioning Concentration: Normal Memory: Recent Intact, Remote Intact IQ: Average Insight: Fair Impulse  Control: Fair Appetite: Good Sleep: No Change Total Hours of Sleep: 7 Vegetative Symptoms: None  ADLScreening [facility] Assessment Services) Patient's cognitive ability adequate to safely complete daily activities?: Yes Patient able to express need for assistance with ADLs?: Yes Independently performs ADLs?: Yes (appropriate for developmental age)  Prior Inpatient Therapy Prior Inpatient Therapy: No  Prior Outpatient Therapy Prior Outpatient Therapy: Yes Prior Therapy Dates: current Prior Therapy Facilty/Provider(s): Dr. Archer AsaGerald Plovsky Reason for Treatment: depression Does patient have an ACCT team?: No Does patient have Intensive In-House Services?  : No Does patient have [facility] services? : No Does patient have P4CC services?: No  ADL Screening (condition at time of admission) Patient's cognitive ability adequate to safely complete daily activities?: Yes Is the patient deaf or have difficulty hearing?: No Does the patient have difficulty seeing, even when wearing glasses/contacts?: Yes (age related macular degeneration) Does the patient have difficulty concentrating, remembering, or making decisions?: No Patient able to express need for assistance with ADLs?: Yes Does the patient have difficulty dressing or bathing?: No Independently performs ADLs?: Yes (appropriate for developmental age) Does the patient have difficulty walking or climbing stairs?: No Weakness of Legs: None Weakness of Arms/Hands: None  Home Assistive Devices/Equipment Home Assistive Devices/Equipment: Walker (specify type)    Abuse/Neglect Assessment (Assessment to be complete while patient is alone) Physical Abuse: Denies Verbal  Abuse: Denies Sexual Abuse: Denies Exploitation of patient/patient's resources: Denies Self-Neglect: Denies     Merchant navy officer (For Healthcare) Does patient have an advance directive?: Yes Would patient like information on creating an advanced directive?: No - patient  declined information Type of Advance Directive:  (pt states she could not recall the type of plan) Does patient want to make changes to advanced directive?: No - Patient declined    Additional Information 1:1 In Past 12 Months?: No CIRT Risk: No Elopement Risk: No Does patient have medical clearance?: Yes     Disposition:  Disposition Initial Assessment Completed for this Encounter: Yes Disposition of Patient: Other dispositions Other disposition(s): Other (Comment) (AM psych eval per Nira Conn, FNP)  Karolee Ohs 05/14/2016 2:18 AM

## 2016-05-14 NOTE — Progress Notes (Signed)
Updated Caryn Beeim of [facility] of pt d/c home with continued HHRN/PT services plus pt scheduled to see her psychiatrist Plosky

## 2016-05-14 NOTE — BH Assessment (Addendum)
[facility] Assessment Progress Note  Per Thedore MinsMojeed Akintayo, MD, this pt does not require psychiatric hospitalization at this time.  Pt has expressed a preference for outpatient services with Archer AsaGerald Plovsky, MD.  She [facility] with this provider's referral information.  After calling [facility] and ascertaining that Dr Plovsky's earliest available appointment at this venue is in 07/2016, this writer called [facility].  Darel HongJudy reports that their earliest appointment with Dr Donell BeersPlovsky is in the second week of 06/2016.  However, they require a $150 deposit, without which they will not schedule an appointment.  She took pt's demographic information.  [facility] contact information has been included in pt's discharge instructions, and this writer has spoken to the pt about these details.  Pt's nurse has been notified of these details.  Doylene Canninghomas Ramesha Poster, MA Triage Specialist [phone]   Addendum:  After further conversation with this pt, she reports that she may have an appointment with Dr Donell BeersPlovsky already.  I called back to [facility] and ascertained that she does have an appointment scheduled for Wednesday, 05/26/2016 at 10:00 am.  This has been included in pt's discharge instructions.  Pt and her nurse have been updated.  Doylene Canninghomas Jai Steil, MA Triage Specialist [phone]

## 2016-05-14 NOTE — Progress Notes (Signed)
[facility] and [facility] services

## 2016-05-14 NOTE — Discharge Instructions (Addendum)
For your ongoing behavioral health needs, you are advised to follow up with Archer AsaGerald Plovsky, MD, at [facility].  You have an appointment scheduled with him on Wednesday, 05/26/2016 at 10:00 am:       [facility]      [address], [address]      [place], [place] [address]      [phone]

## 2016-05-14 NOTE — Consult Note (Signed)
[facility] Psychiatry Consult   Reason for Consult:  hallucinations Referring Physician:  EDP Patient Identification: Lauren Thomas MRN:  [id number] Principal Diagnosis: Hallucinations Diagnosis:   Patient Active Problem List   Diagnosis Date Noted  . Anxiety [F41.9] 05/14/2016    Priority: High  . Hallucinations [R44.3] 05/14/2016    Priority: High  . Memory loss [R41.3] 04/27/2016  . Parkinsonism ([place]) [G20] 08/11/2015  . Abnormality of gait [R26.9] 08/11/2015  . Elevated troponin [R74.8] 10/12/2013  . Diabetes mellitus type 2 in obese ([place]) [E11.69, E66.9] 10/12/2013  . Palpitations [R00.2] 10/12/2013  . Abnormal finding on EKG [R94.31] 10/12/2013  . LUNG NODULE- evaluated 2012 [J98.4] 07/17/2010  . HYPERTENSION, BENIGN [I10] 01/06/2010  . OBESITY-MORBID [E66.01] 12/31/2009  . CAD, NATIVE VESSEL- mild CAD at cath 2002 and coronary CT 07/11 [I25.10] 12/31/2009  . CHEST PAIN-UNSPECIFIED [R07.9] 12/31/2009    Total Time spent with patient: 45 minutes  Subjective:   Lauren Thomas is a 81 y.o. female patient presented to the ED for hallucinations and anxiety.  HPI:  81 yo female who presented to the ED with hallucinations and anxiety.  She came to be seen by Dr. Casimiro Needle as she was told he would be here.  Ms. Dado reports she wants to stop her Xanax which she has been on for years and contributes this to some of her issues.  Her hallucinations consist of seeing people on her porch only at her house.  She denies suicidal/homicidal ideations and alcohol/drug abuse.  Ms. Cullin would like to start seeing Dr. Casimiro Needle versus her current psychiatrist, appointment in place for January but was hoping to see him sooner.  Counselor did get her a potential appointment in the middle of December, if she wants it.  Patient requests to leave and not in crisis, stable for discharge.  Past Psychiatric History: depression, anxiety  Risk to Self: Suicidal Ideation: No Suicidal Intent: No Is  patient at risk for suicide?: No Suicidal Plan?: No Access to Means: No What has been your use of drugs/alcohol within the last 12 months?: denies Triggers for Past Attempts: None known Intentional Self Injurious Behavior: None Risk to Others: Homicidal Ideation: No Thoughts of Harm to Others: No Current Homicidal Intent: No Current Homicidal Plan: No Access to Homicidal Means: No History of harm to others?: No Assessment of Violence: None Noted Does patient have access to weapons?: No Criminal Charges Pending?: No Does patient have a court date: No Prior Inpatient Therapy: Prior Inpatient Therapy: No Prior Outpatient Therapy: Prior Outpatient Therapy: Yes Prior Therapy Dates: current Prior Therapy Facilty/Provider(s): Dr. Norma Fredrickson Reason for Treatment: depression Does patient have an ACCT team?: No Does patient have Intensive In-House Services?  : No Does patient have [facility] services? : No Does patient have P4CC services?: No  Past Medical History:  Past Medical History:  Diagnosis Date  . Anxiety   . Arthritis   . Diabetes mellitus without complication ([place])   . Irregular heart rhythm   . Macular degeneration   . Memory loss     Past Surgical History:  Procedure Laterality Date  . ABDOMINAL HYSTERECTOMY    . BACK SURGERY    . CATARACT EXTRACTION    . CHOLECYSTECTOMY    . LEFT HEART CATHETERIZATION WITH CORONARY ANGIOGRAM N/A 10/15/2013   Procedure: LEFT HEART CATHETERIZATION WITH CORONARY ANGIOGRAM;  Surgeon: Sinclair Grooms, MD;  Location: [facility] CATH LAB;  Service: Cardiovascular;  Laterality: N/A;   Family History:  Family History  Problem Relation Age of Onset  . Heart attack Mother   . Heart disease Mother   . Diabetes Mother   . Prostate cancer Father   . Diabetes Maternal Grandmother   . Heart disease Brother   . Lung cancer Brother    Family Psychiatric  History: none Social History:  History  Alcohol Use No     History  Drug Use No     Social History   Social History  . Marital status: Legally Separated    Spouse name: N/A  . Number of children: 3  . Years of education: BA   Occupational History  . Retired    Social History Main Topics  . Smoking status: Never Smoker  . Smokeless tobacco: Never Used  . Alcohol use No  . Drug use: No  . Sexual activity: Not Asked   Other Topics Concern  . None   Social History Narrative   Lives at home with her friend, [name].   Right-handed.   2-3 cups caffeine daily.   Additional Social History:    Allergies:   Allergies  Allergen Reactions  . Erythromycin     Heart flutter  . Penicillins     Patient has no idea what her reaction is   . Red Dye   . Sulfonamide Derivatives     diarrhea  . Latex Rash  . Neosporin [Neomycin-Bacitracin Zn-Polymyx] Rash  . Polysporin [Bacitracin-Polymyxin B] Rash    Labs:  Results for orders placed or performed during the hospital encounter of 05/13/16 (from the past 48 hour(s))  Comprehensive metabolic panel     Status: Abnormal   Collection Time: 05/13/16  8:25 PM  Result Value Ref Range   Sodium 135 135 - 145 mmol/L   Potassium 4.4 3.5 - 5.1 mmol/L   Chloride 102 101 - 111 mmol/L   CO2 25 22 - 32 mmol/L   Glucose, Bld 114 (H) 65 - 99 mg/dL   BUN 17 6 - 20 mg/dL   Creatinine, Ser 1.15 (H) 0.44 - 1.00 mg/dL   Calcium 8.9 8.9 - 10.3 mg/dL   Total Protein 7.4 6.5 - 8.1 g/dL   Albumin 3.8 3.5 - 5.0 g/dL   AST 21 15 - 41 U/L   ALT 6 (L) 14 - 54 U/L   Alkaline Phosphatase 65 38 - 126 U/L   Total Bilirubin 0.7 0.3 - 1.2 mg/dL   GFR calc non Af Amer 44 (L) >60 mL/min   GFR calc Af Amer 51 (L) >60 mL/min    Comment: (NOTE) The eGFR has been calculated using the CKD EPI equation. This calculation has not been validated in all clinical situations. eGFR's persistently <60 mL/min signify possible Chronic Kidney Disease.    Anion gap 8 5 - 15  Ethanol     Status: None   Collection Time: 05/13/16  8:25 PM  Result  Value Ref Range   Alcohol, Ethyl (B) <5 <5 mg/dL    Comment:        LOWEST DETECTABLE LIMIT FOR SERUM ALCOHOL IS 5 mg/dL FOR MEDICAL PURPOSES ONLY   cbc     Status: None   Collection Time: 05/13/16  8:25 PM  Result Value Ref Range   WBC 9.5 4.0 - 10.5 K/uL   RBC 4.46 3.87 - 5.11 MIL/uL   Hemoglobin 13.5 12.0 - 15.0 g/dL   HCT 39.7 36.0 - 46.0 %   MCV 89.0 78.0 - 100.0 fL   MCH 30.3 26.0 - 34.0  pg   MCHC 34.0 30.0 - 36.0 g/dL   RDW 13.8 11.5 - 15.5 %   Platelets 203 150 - 400 K/uL  Rapid urine drug screen (hospital performed)     Status: Abnormal   Collection Time: 05/13/16 10:03 PM  Result Value Ref Range   Opiates NONE DETECTED NONE DETECTED   Cocaine NONE DETECTED NONE DETECTED   Benzodiazepines POSITIVE (A) NONE DETECTED   Amphetamines NONE DETECTED NONE DETECTED   Tetrahydrocannabinol NONE DETECTED NONE DETECTED   Barbiturates NONE DETECTED NONE DETECTED    Comment:        DRUG SCREEN FOR MEDICAL PURPOSES ONLY.  IF CONFIRMATION IS NEEDED FOR ANY PURPOSE, NOTIFY LAB WITHIN 5 DAYS.        LOWEST DETECTABLE LIMITS FOR URINE DRUG SCREEN Drug Class       Cutoff (ng/mL) Amphetamine      1000 Barbiturate      200 Benzodiazepine   932 Tricyclics       671 Opiates          300 Cocaine          300 THC              50   Urinalysis, Routine w reflex microscopic     Status: Abnormal   Collection Time: 05/13/16 10:03 PM  Result Value Ref Range   Color, Urine YELLOW YELLOW   APPearance CLOUDY (A) CLEAR   Specific Gravity, Urine 1.011 1.005 - 1.030   pH 5.5 5.0 - 8.0   Glucose, UA NEGATIVE NEGATIVE mg/dL   Hgb urine dipstick NEGATIVE NEGATIVE   Bilirubin Urine NEGATIVE NEGATIVE   Ketones, ur NEGATIVE NEGATIVE mg/dL   Protein, ur NEGATIVE NEGATIVE mg/dL   Nitrite NEGATIVE NEGATIVE   Leukocytes, UA LARGE (A) NEGATIVE  Urine microscopic-add on     Status: Abnormal   Collection Time: 05/13/16 10:03 PM  Result Value Ref Range   Squamous Epithelial / LPF 0-5 (A) NONE SEEN    WBC, UA 6-30 0 - 5 WBC/hpf   RBC / HPF NONE SEEN 0 - 5 RBC/hpf   Bacteria, UA FEW (A) NONE SEEN    Current Facility-Administered Medications  Medication Dose Route Frequency Provider Last Rate Last Dose  . acetaminophen (TYLENOL) tablet 650 mg  650 mg Oral Q4H PRN Roxanna Mew, PA-C      . carbidopa-levodopa (SINEMET IR) 25-100 MG per tablet immediate release 2 tablet  2 tablet Oral TID Roxanna Mew, PA-C   2 tablet at 05/14/16 1028  . donepezil (ARICEPT) tablet 10 mg  10 mg Oral QHS Roxanna Mew, PA-C      . hyoscyamine [facility]) disintergrating tablet 0.125 mg  0.125 mg Sublingual Q6H PRN Roxanna Mew, PA-C      . LORazepam (ATIVAN) tablet 1 mg  1 mg Oral Q8H PRN Roxanna Mew, PA-C      . losartan (COZAAR) tablet 25 mg  25 mg Oral Daily Roxanna Mew, PA-C   25 mg at 05/14/16 1029  . nicotine (NICODERM CQ - dosed in mg/24 hours) patch 21 mg  21 mg Transdermal Daily Roxanna Mew, PA-C      . ondansetron [facility]) tablet 4 mg  4 mg Oral Q8H PRN Roxanna Mew, PA-C       Current Outpatient Prescriptions  Medication Sig Dispense Refill  . carbidopa-levodopa (SINEMET) 25-100 MG tablet Take 2 tablets by mouth 3 (three) times daily. 180 tablet 6  . diclofenac (  VOLTAREN) 75 MG EC tablet Take 75 mg by mouth 2 (two) times daily.   0  . donepezil (ARICEPT) 10 MG tablet Take 1 tablet (10 mg total) by mouth at bedtime. 30 tablet 11  . guaiFENesin (MUCINEX) 600 MG 12 hr tablet Take 600 mg by mouth 2 (two) times daily as needed for cough or to loosen phlegm.    . hydroxypropyl methylcellulose (ISOPTO TEARS) 2.5 % ophthalmic solution 1 drop. AS DIRECTED- ALSO KNOWN AS ARTIFICAL TEARS    . hyoscyamine (ANASPAZ) 0.125 MG TBDP disintergrating tablet Place 0.125 mg under the tongue every 6 (six) hours as needed for bladder spasms or cramping.    Marland Kitchen LORazepam (ATIVAN) 0.5 MG tablet Take 0.5 mg by mouth as needed for anxiety.   3  . losartan (COZAAR) 25 MG  tablet Take 25 mg by mouth daily.     Marland Kitchen PRESCRIPTION MEDICATION Vaginal cream/wash prescribed from  Urologist Patient did not know the name of it and family has not been able to find it at any pharmacies.      Musculoskeletal: Strength & Muscle Tone: within normal limits Gait & Station: normal Patient leans: N/A  Psychiatric Specialty Exam: Physical Exam  Constitutional: She is oriented to person, place, and time. She appears well-developed and well-nourished.  HENT:  Head: Normocephalic.  Neck: Normal range of motion.  Respiratory: Effort normal.  Musculoskeletal: Normal range of motion.  Neurological: She is alert and oriented to person, place, and time.  Skin: Skin is warm and dry.  Psychiatric: Her speech is normal. Judgment and thought content normal. Her mood appears anxious. She is actively hallucinating. Cognition and memory are normal.    Review of Systems  Constitutional: Negative.   HENT: Negative.   Eyes: Negative.   Respiratory: Negative.   Cardiovascular: Negative.   Gastrointestinal: Negative.   Genitourinary: Negative.   Musculoskeletal: Negative.   Skin: Negative.   Neurological: Negative.   Endo/Heme/Allergies: Negative.   Psychiatric/Behavioral: Positive for hallucinations. The patient is nervous/anxious.     Blood pressure 144/90, pulse 73, temperature 97.5 F (36.4 C), temperature source Oral, resp. rate 18, SpO2 99 %.There is no height or weight on file to calculate BMI.  General Appearance: Casual  Eye Contact:  Good  Speech:  Normal Rate  Volume:  Normal  Mood:  Anxious  Affect:  Congruent  Thought Process:  Coherent and Descriptions of Associations: Intact  Orientation:  Full (Time, Place, and Person)  Thought Content:  WDL  Suicidal Thoughts:  No  Homicidal Thoughts:  No  Memory:  Immediate;   Good Recent;   Good Remote;   Good  Judgement:  Good  Insight:  Good  Psychomotor Activity:  Normal  Concentration:  Concentration: Good and  Attention Span: Good  Recall:  Good  Fund of Knowledge:  Good  Language:  Good  Akathisia:  No  Handed:  Right  AIMS (if indicated):     Assets:  Housing Leisure Time Physical Health Resilience Social Support  ADL's:  Intact  Cognition:  WNL  Sleep:       Treatment Plan Summary: Daily contact with patient to assess and evaluate symptoms and progress in treatment, Medication management and Plan hallucinations:  -Crisis stabilization -Medication management:  Medical medications continued except her Xanax which was changed to Ativan PRN for anxiety -Individual counseling  Disposition: No evidence of imminent risk to self or others at present.    Waylan Boga, NP 05/14/2016 10:39 AM  Patient seen face-to-face for  psychiatric evaluation, chart reviewed and case discussed with the physician extender and developed treatment plan. Reviewed the information documented and agree with the treatment plan. Corena Pilgrim, MD

## 2016-05-15 NOTE — [facility] Suicide Risk Assessment (Signed)
Suicide Risk Assessment  Discharge Assessment   [facility]   Principal Problem: Hallucinations Discharge Diagnoses:  Patient Active Problem List   Diagnosis Date Noted  . Anxiety [F41.9] 05/14/2016    Priority: High  . Hallucinations [R44.3] 05/14/2016    Priority: High  . Memory loss [R41.3] 04/27/2016  . Parkinsonism (HCC) [G20] 08/11/2015  . Abnormality of gait [R26.9] 08/11/2015  . Elevated troponin [R74.8] 10/12/2013  . Diabetes mellitus type 2 in obese (HCC) [E11.69, E66.9] 10/12/2013  . Palpitations [R00.2] 10/12/2013  . Abnormal finding on EKG [R94.31] 10/12/2013  . LUNG NODULE- evaluated 2012 [J98.4] 07/17/2010  . HYPERTENSION, BENIGN [I10] 01/06/2010  . OBESITY-MORBID [E66.01] 12/31/2009  . CAD, NATIVE VESSEL- mild CAD at cath 2002 and coronary CT 07/11 [I25.10] 12/31/2009  . CHEST PAIN-UNSPECIFIED [R07.9] 12/31/2009    Total Time spent with patient: 45 minutes  Musculoskeletal: Strength & Muscle Tone: within normal limits Gait & Station: normal Patient leans: N/A  Psychiatric Specialty Exam: Physical Exam  Constitutional: She is oriented to person, place, and time. She appears well-developed and well-nourished.  HENT:  Head: Normocephalic.  Neck: Normal range of motion.  Respiratory: Effort normal.  Musculoskeletal: Normal range of motion.  Neurological: She is alert and oriented to person, place, and time.  Skin: Skin is warm and dry.  Psychiatric: Her speech is normal. Judgment and thought content normal. Her mood appears anxious. She is actively hallucinating. Cognition and memory are normal.    Review of Systems  Constitutional: Negative.   HENT: Negative.   Eyes: Negative.   Respiratory: Negative.   Cardiovascular: Negative.   Gastrointestinal: Negative.   Genitourinary: Negative.   Musculoskeletal: Negative.   Skin: Negative.   Neurological: Negative.   Endo/Heme/Allergies: Negative.   Psychiatric/Behavioral:  Positive for hallucinations. The patient is nervous/anxious.     Blood pressure 144/90, pulse 73, temperature 97.5 F (36.4 C), temperature source Oral, resp. rate 18, SpO2 99 %.There is no height or weight on file to calculate BMI.  General Appearance: Casual  Eye Contact:  Good  Speech:  Normal Rate  Volume:  Normal  Mood:  Anxious  Affect:  Congruent  Thought Process:  Coherent and Descriptions of Associations: Intact  Orientation:  Full (Time, Place, and Person)  Thought Content:  WDL  Suicidal Thoughts:  No  Homicidal Thoughts:  No  Memory:  Immediate;   Good Recent;   Good Remote;   Good  Judgement:  Good  Insight:  Good  Psychomotor Activity:  Normal  Concentration:  Concentration: Good and Attention Span: Good  Recall:  Good  Fund of Knowledge:  Good  Language:  Good  Akathisia:  No  Handed:  Right  AIMS (if indicated):     Assets:  Housing Leisure Time Physical Health Resilience Social Support  ADL's:  Intact  Cognition:  WNL  Sleep:       Mental Status Per Nursing Assessment::   On Admission:   hallucinations, anxiety  Demographic Factors:  Age 79 or older and Caucasian  Loss Factors: NA  Historical Factors: NA  Risk Reduction Factors:   Sense of responsibility to family, Living with another person, especially a relative and Positive social support  Continued Clinical Symptoms:  Anxiety, mild  Cognitive Features That Contribute To Risk:  None    Suicide Risk:  Minimal: No identifiable suicidal ideation.  Patients presenting with no risk factors but with morbid ruminations; may be classified as minimal risk based on the severity of the  depressive symptoms  Follow-up Information    [facility] Call today.   Why:  You have been confirmed to have home health nurse and physical therapy services  Contact information: [address] [place] [address] [phone]           Plan Of Care/Follow-up recommendations:   Activity:  as tolerated Diet:  heart healthy diet  Barabara Motz, NP 05/15/2016, 9:16 AM

## 2016-05-21 NOTE — Telephone Encounter (Signed)
Drew from [employer] has transferred patient to another company. I called and left patient's husband a message to call me back.

## 2016-05-30 ENCOUNTER — Encounter (HOSPITAL_COMMUNITY): Payer: Self-pay | Admitting: Emergency Medicine

## 2016-05-30 ENCOUNTER — Emergency Department (HOSPITAL_COMMUNITY)
Admission: EM | Admit: 2016-05-30 | Discharge: 2016-05-31 | Disposition: A | Discharge status: Home / Self Care | Payer: Medicare Other | Attending: Emergency Medicine | Admitting: Emergency Medicine

## 2016-05-30 ENCOUNTER — Encounter (HOSPITAL_COMMUNITY): Payer: Self-pay

## 2016-05-30 ENCOUNTER — Emergency Department (HOSPITAL_COMMUNITY)
Admission: EM | Admit: 2016-05-30 | Discharge: 2016-05-30 | Disposition: A | Discharge status: Home / Self Care | Payer: Medicare Other | Source: Home / Self Care | Attending: Emergency Medicine | Admitting: Emergency Medicine

## 2016-05-30 DIAGNOSIS — R413 Other amnesia: Secondary | ICD-10-CM | POA: Diagnosis present

## 2016-05-30 DIAGNOSIS — Z5181 Encounter for therapeutic drug level monitoring: Secondary | ICD-10-CM | POA: Diagnosis not present

## 2016-05-30 DIAGNOSIS — G20 Parkinson's disease: Secondary | ICD-10-CM

## 2016-05-30 DIAGNOSIS — E119 Type 2 diabetes mellitus without complications: Secondary | ICD-10-CM | POA: Diagnosis not present

## 2016-05-30 DIAGNOSIS — I10 Essential (primary) hypertension: Secondary | ICD-10-CM | POA: Diagnosis not present

## 2016-05-30 DIAGNOSIS — E11649 Type 2 diabetes mellitus with hypoglycemia without coma: Secondary | ICD-10-CM

## 2016-05-30 DIAGNOSIS — E162 Hypoglycemia, unspecified: Secondary | ICD-10-CM

## 2016-05-30 DIAGNOSIS — Z79899 Other long term (current) drug therapy: Secondary | ICD-10-CM

## 2016-05-30 DIAGNOSIS — I2510 Atherosclerotic heart disease of native coronary artery without angina pectoris: Secondary | ICD-10-CM

## 2016-05-30 DIAGNOSIS — Z794 Long term (current) use of insulin: Secondary | ICD-10-CM

## 2016-05-30 DIAGNOSIS — Z91040 Latex allergy status: Secondary | ICD-10-CM | POA: Diagnosis not present

## 2016-05-30 DIAGNOSIS — IMO0001 Reserved for inherently not codable concepts without codable children: Secondary | ICD-10-CM

## 2016-05-30 LAB — COMPREHENSIVE METABOLIC PANEL
ALT: 16 U/L (ref 14–54)
AST: 27 U/L (ref 15–41)
Albumin: 3.5 g/dL (ref 3.5–5.0)
Alkaline Phosphatase: 64 U/L (ref 38–126)
Anion gap: 6 (ref 5–15)
BUN: 15 mg/dL (ref 6–20)
CO2: 27 mmol/L (ref 22–32)
Calcium: 8.9 mg/dL (ref 8.9–10.3)
Chloride: 105 mmol/L (ref 101–111)
Creatinine, Ser: 0.85 mg/dL (ref 0.44–1.00)
GFR calc Af Amer: >60 mL/min (ref >60)
GFR calc non Af Amer: >60 mL/min (ref >60)
Glucose, Bld: 61 mg/dL — ABNORMAL LOW (ref 65–99)
Potassium: 5.3 mmol/L — ABNORMAL HIGH (ref 3.5–5.1)
Sodium: 138 mmol/L (ref 135–145)
Total Bilirubin: 1.5 mg/dL — ABNORMAL HIGH (ref 0.3–1.2)
Total Protein: 6.9 g/dL (ref 6.5–8.1)

## 2016-05-30 LAB — DIFFERENTIAL
Basophils Absolute: 0 10*3/uL (ref 0.0–0.1)
Basophils Relative: 0 %
EOS PCT: 1 %
Eosinophils Absolute: 0.1 10*3/uL (ref 0.0–0.7)
LYMPHS PCT: 18 %
Lymphs Abs: 1.8 10*3/uL (ref 0.7–4.0)
MONO ABS: 0.5 10*3/uL (ref 0.1–1.0)
MONOS PCT: 5 %
NEUTROS ABS: 7.7 10*3/uL (ref 1.7–7.7)
Neutrophils Relative %: 76 %

## 2016-05-30 LAB — URINALYSIS, ROUTINE W REFLEX MICROSCOPIC
BILIRUBIN URINE: NEGATIVE
GLUCOSE, UA: NEGATIVE mg/dL
Hgb urine dipstick: NEGATIVE
KETONES UR: NEGATIVE mg/dL
Leukocytes, UA: NEGATIVE
NITRITE: NEGATIVE
PH: 6 (ref 5.0–8.0)
Protein, ur: NEGATIVE mg/dL
SPECIFIC GRAVITY, URINE: 1.01 (ref 1.005–1.030)

## 2016-05-30 LAB — CBG MONITORING, ED
GLUCOSE-CAPILLARY: 91 mg/dL (ref 65–99)
Glucose-Capillary: 110 mg/dL — ABNORMAL HIGH (ref 65–99)
Glucose-Capillary: 210 mg/dL — ABNORMAL HIGH (ref 65–99)
Glucose-Capillary: 93 mg/dL (ref 65–99)

## 2016-05-30 LAB — I-STAT CHEM 8, ED
BUN: 14 mg/dL (ref 6–20)
Calcium, Ion: 1.12 mmol/L — ABNORMAL LOW (ref 1.15–1.40)
Chloride: 104 mmol/L (ref 101–111)
Creatinine, Ser: 0.9 mg/dL (ref 0.44–1.00)
Glucose, Bld: 151 mg/dL — ABNORMAL HIGH (ref 65–99)
HCT: 40 % (ref 36.0–46.0)
Hemoglobin: 13.6 g/dL (ref 12.0–15.0)
Potassium: 4.5 mmol/L (ref 3.5–5.1)
Sodium: 136 mmol/L (ref 135–145)
TCO2: 26 mmol/L (ref 0–100)

## 2016-05-30 LAB — CBC
HCT: 41.2 % (ref 36.0–46.0)
HCT: 40.9 % (ref 36.0–46.0)
Hemoglobin: 14.2 g/dL (ref 12.0–15.0)
Hemoglobin: 14.1 g/dL (ref 12.0–15.0)
MCH: 30.7 pg (ref 26.0–34.0)
MCH: 30.7 pg (ref 26.0–34.0)
MCHC: 34.5 g/dL (ref 30.0–36.0)
MCHC: 34.5 g/dL (ref 30.0–36.0)
MCV: 89 fL (ref 78.0–100.0)
MCV: 88.9 fL (ref 78.0–100.0)
Platelets: 191 10*3/uL (ref 150–400)
Platelets: 200 10*3/uL (ref 150–400)
RBC: 4.63 MIL/uL (ref 3.87–5.11)
RBC: 4.6 MIL/uL (ref 3.87–5.11)
RDW: 14.1 % (ref 11.5–15.5)
RDW: 14 % (ref 11.5–15.5)
WBC: 9.4 10*3/uL (ref 4.0–10.5)
WBC: 10.1 10*3/uL (ref 4.0–10.5)

## 2016-05-30 LAB — I-STAT TROPONIN, ED: Troponin i, poc: 0 ng/mL (ref 0.00–0.08)

## 2016-05-30 NOTE — ED Notes (Signed)
Bed: WA16 Expected date:  Expected time:  Means of arrival:  Comments: hypoglycemia 

## 2016-05-30 NOTE — ED Provider Notes (Addendum)
[facility]-EMERGENCY DEPT Provider Note   CSN: [id number] Arrival date & time: 05/30/16  2312   By signing my name below, I, Nelwyn Salisbury, attest that this documentation has been prepared under the direction and in the presence of Derwood Kaplan, MD . Electronically Signed: Nelwyn Salisbury, Scribe. 05/30/2016. 11:43 PM.   History   Chief Complaint Chief Complaint  Patient presents with  . Memory Loss   LEVEL 5 CAVEAT DUE TO MEMORY LOSS  HPI  HPI Comments:  Lauren Thomas is a 81 y.o. female with pmhx of DM and macular degeneration brought in by EMS who presents to the Emergency Department complaining of sudden-onset gradually worsning memory loss s/p discharge from [facility] earlier today. Pt notes that she was discharged from [facility] about 10 hours ago after an episode of hypoglycemia and when she got home, she couldn't remember how to administer her insulin. After a few hours of progressive memory loss, Pt called EMS. She denies any numbness, tingling, new visual problems, dizziness or lightheadedness.   Past Medical History:  Diagnosis Date  . Anxiety   . Arthritis   . Diabetes mellitus without complication (HCC)   . Irregular heart rhythm   . Macular degeneration   . Memory loss     Patient Active Problem List   Diagnosis Date Noted  . Anxiety 05/14/2016  . Hallucinations 05/14/2016  . Memory loss 04/27/2016  . Parkinsonism (HCC) 08/11/2015  . Abnormality of gait 08/11/2015  . Elevated troponin 10/12/2013  . Diabetes mellitus type 2 in obese (HCC) 10/12/2013  . Palpitations 10/12/2013  . Abnormal finding on EKG 10/12/2013  . LUNG NODULE- evaluated 2012 07/17/2010  . HYPERTENSION, BENIGN 01/06/2010  . OBESITY-MORBID 12/31/2009  . CAD, NATIVE VESSEL- mild CAD at cath 2002 and coronary CT 07/11 12/31/2009  . CHEST PAIN-UNSPECIFIED 12/31/2009    Past Surgical History:  Procedure Laterality Date  . ABDOMINAL HYSTERECTOMY    . BACK SURGERY    . CATARACT  EXTRACTION    . CHOLECYSTECTOMY    . LEFT HEART CATHETERIZATION WITH CORONARY ANGIOGRAM N/A 10/15/2013   Procedure: LEFT HEART CATHETERIZATION WITH CORONARY ANGIOGRAM;  Surgeon: Lesleigh Noe, MD;  Location[facility];  Service: Cardiovascular;  Laterality: N/A;    OB History    No data available       Home Medications    Prior to Admission medications   Medication Sig Start Date End Date Taking? Authorizing Provider  carbidopa-levodopa (SINEMET) 25-100 MG tablet Take 2 tablets by mouth 3 (three) times daily. 04/27/16   Levert Feinstein, MD  diclofenac (VOLTAREN) 75 MG EC tablet Take 75 mg by mouth 2 (two) times daily.  04/10/16   Historical Provider, MD  donepezil (ARICEPT) 10 MG tablet Take 1 tablet (10 mg total) by mouth at bedtime. 04/27/16   Levert Feinstein, MD  guaiFENesin (MUCINEX) 600 MG 12 hr tablet Take 600 mg by mouth 2 (two) times daily as needed for cough or to loosen phlegm.    Historical Provider, MD  hydroxypropyl methylcellulose (ISOPTO TEARS) 2.5 % ophthalmic solution 1 drop. AS DIRECTED- ALSO KNOWN AS ARTIFICAL TEARS    Historical Provider, MD  hyoscyamine (ANASPAZ) 0.125 MG TBDP disintergrating tablet Place 0.125 mg under the tongue every 6 (six) hours as needed for bladder spasms or cramping.    Historical Provider, MD  LORazepam (ATIVAN) 0.5 MG tablet Take 0.5 mg by mouth as needed for anxiety.  04/14/16   Historical Provider, MD  losartan (COZAAR) 25  MG tablet Take 25 mg by mouth daily.  04/23/16   Historical Provider, MD  PRESCRIPTION MEDICATION Vaginal cream/wash prescribed from  Urologist Patient did not know the name of it and family has not been able to find it at any pharmacies.    Historical Provider, MD    Family History Family History  Problem Relation Age of Onset  . Heart attack Mother   . Heart disease Mother   . Diabetes Mother   . Prostate cancer Father   . Diabetes Maternal Grandmother   . Heart disease Brother   . Lung cancer Brother     Social  History Social History  Substance Use Topics  . Smoking status: Never Smoker  . Smokeless tobacco: Never Used  . Alcohol use No     Allergies   Erythromycin; Penicillins; Red dye; Sulfonamide derivatives; Latex; Neosporin [neomycin-bacitracin zn-polymyx]; and Polysporin [bacitracin-polymyxin b]   Review of Systems Review of Systems  Unable to perform ROS: Mental status change     Physical Exam Updated Vital Signs BP 135/60   Pulse 70   Temp 98 F (36.7 C)   Resp 17   Ht 5\' 7"  (1.702 m)   Wt 240 lb (108.9 kg)   SpO2 99%   BMI 37.59 kg/m   Physical Exam  Constitutional: She is oriented to person, place, and time. She appears well-developed and well-nourished. No distress.  HENT:  Head: Normocephalic and atraumatic.  Eyes: EOM are normal. Pupils are equal, round, and reactive to light.  Neck: Normal range of motion.  Cardiovascular: Normal rate, regular rhythm and normal heart sounds.   Pulmonary/Chest: Effort normal and breath sounds normal.  Abdominal: Soft. She exhibits no distension. There is no tenderness.  Musculoskeletal: Normal range of motion.  Neurological: She is alert and oriented to person, place, and time. No cranial nerve deficit or sensory deficit.  Cranial nerve 2-12 intact. Coarse upper and lower sensory exam normal. Upper extremity strength 4+/5 Cerebella exam reveals no dysmetria. Upper and lower extremity strength 4+/5.  Skin: Skin is warm and dry.  Psychiatric: She has a normal mood and affect. Judgment normal.  Nursing note and vitals reviewed.    ED Treatments / Results  DIAGNOSTIC STUDIES:  Oxygen Saturation is 96% on RA, adequate by my interpretation.    COORDINATION OF CARE:  12:10 AM Discussed treatment plan with pt at bedside which includes MRI and pt agreed to plan.  Labs (all labs ordered are listed, but only abnormal results are displayed) Labs Reviewed  COMPREHENSIVE METABOLIC PANEL - Abnormal; Notable for the following:        Result Value   Glucose, Bld 158 (*)    Albumin 3.4 (*)    GFR calc non Af Amer 52 (*)    All other components within normal limits  CBG MONITORING, ED - Abnormal; Notable for the following:    Glucose-Capillary 210 (*)    All other components within normal limits  I-STAT CHEM 8, ED - Abnormal; Notable for the following:    Glucose, Bld 151 (*)    Calcium, Ion 1.12 (*)    All other components within normal limits  PROTIME-INR  APTT  CBC  DIFFERENTIAL  I-STAT TROPOININ, ED    EKG  EKG Interpretation None       Radiology Ct Head Wo Contrast  Result Date: 05/31/2016 CLINICAL DATA:  Urological symptoms. History of diabetes. Stroke symptoms greater than 8 hours. EXAM: CT HEAD WITHOUT CONTRAST TECHNIQUE: Contiguous axial images were  obtained from the base of the skull through the vertex without intravenous contrast. COMPARISON:  MRI brain 04/23/2016 FINDINGS: Brain: Mild cerebral atrophy. Minimal ventricular dilatation consistent with central atrophy. Patchy low-attenuation changes in the deep white matter consistent with small vessel ischemia. No mass effect or midline shift. No abnormal extra-axial fluid collections. Gray-white matter junctions are distinct. Basal cisterns are not effaced. No acute intracranial hemorrhage. Vascular: No hyperdense vessel or unexpected calcification. Skull: Normal. Negative for fracture or focal lesion. Sinuses/Orbits: No acute finding. Other: None. IMPRESSION: No acute intracranial abnormalities. Mild chronic atrophy and small vessel ischemic changes. Electronically Signed   By: Burman NievesWilliam  Stevens M.D.   On: 05/31/2016 01:12   Mr Brain Wo Contrast  Result Date: 05/31/2016 CLINICAL DATA:  Recent hypoglycemic episodes. Short-term memory loss. Altered mental status. EXAM: MRI HEAD WITHOUT CONTRAST TECHNIQUE: Multiplanar, multiecho pulse sequences of the brain and surrounding structures were obtained without intravenous contrast. COMPARISON:  Brain MRI  04/23/2016 FINDINGS: Brain: No acute infarct or intraparenchymal hemorrhage. The sella is shallow. Somewhat enlarged appearance of the pituitary gland is probably exaggerated by the shallow sella. There is multifocal hyperintense T2-weighted signal within the periventricular white matter, most often seen in the setting of chronic microvascular ischemia. No mass lesion or midline shift. No hydrocephalus or extra-axial fluid collection. No age advanced or lobar predominant atrophy. Vascular: Major intracranial arterial and venous sinus flow voids are preserved. No evidence of chronic microhemorrhage or amyloid angiopathy. Skull and upper cervical spine: There is disc disease at C4-5 and C5-6, incompletely visualized, with suspected spinal canal stenosis. The visualized calvarium and skull base are normal. Sinuses/Orbits: No fluid levels or advanced mucosal thickening. No mastoid effusion. Right lens replacement. IMPRESSION: 1. No acute intracranial abnormality. 2. Midcervical degenerative disc disease with suspected spinal canal stenosis, incompletely visualized. Electronically Signed   By: Deatra RobinsonKevin  Herman M.D.   On: 05/31/2016 04:28    Procedures Procedures (including critical care time)  Medications Ordered in ED Medications  LORazepam (ATIVAN) injection 1 mg (1 mg Intravenous Given 05/31/16 0322)     Initial Impression / Assessment and Plan / ED Course  I have reviewed the triage vital signs and the nursing notes.  Pertinent labs & imaging results that were available during my care of the patient were reviewed by me and considered in my medical decision making (see chart for details).  Clinical Course as of Jun 01 615  [facility]  [id number] Spoke with Dr. Kathlee NationsLynzon, Neuro. With a neg MRI, he doesn't think there is any emergent pathology. He recommends outpatient f/u with pcp.  Results from the ER workup discussed with the patient face to face and all questions answered to the best of my ability.    [AN]  [phone] RN educated patient on how to give insulin. I asked the patient just now how to administer insulin and she gave a proper response. Pt has a home nurse that comes 2x / week, and pre-fills insulin. I will ask SW to call patient and check with her later today on home arrangements. Pt's # is [phone]  [AN]    Clinical Course User Index [AN] Derwood KaplanAnkit Nevin Kozuch, MD   I personally performed the services described in this documentation, which was scribed in my presence. The recorded information has been reviewed and is accurate.  Pt comes in with cc of memory loss. She couldn't remember how to give herself insulin. She had an episode of hypoglycemia earlier.  Global amnesia - mild  vs stroke vs. Vascular dementia.  Pt is ao x 3. She is answering all the questions properly. MRI ordered.    Final Clinical Impressions(s) / ED Diagnoses   Final diagnoses:  Amnesia/memory disorder  Memory loss    New Prescriptions New Prescriptions   No medications on file   I personally performed the services described in this documentation, which was scribed in my presence. The recorded information has been reviewed and is accurate.    Derwood KaplanAnkit Dalicia Kisner, MD 05/31/16 [id number]    Derwood KaplanAnkit Durrell Barajas, MD 05/31/16 [phone]

## 2016-05-30 NOTE — ED Triage Notes (Signed)
Pt from home by EMS. EMS reports [facility] 2pm today for low blood sugar. Pt went home and started to have short term memory loss. Per EMS pt is unable to remember how to take her insulin injections and no definite last seen well.CBG with EMS was 178. Pt alert and oriented x4 upon arival. Pt in NAD at this time.

## 2016-05-30 NOTE — ED Notes (Signed)
Lunch tray provided to pt.

## 2016-05-30 NOTE — Discharge Instructions (Signed)
It was our pleasure to provide your ER care today - we hope that you feel better.  Your blood sugar was low today.  If taking insulin, it is very important that you do not skip or miss meals/snacks.  Check your blood sugar 4x/day, before meals and at bedtime, and record values.   As your blood sugar was low, contact your doctor today to discuss adjustment (I.e. Decrease) of your insulin dose.  If unable to reach your doctor today, decrease the dose of your insulin to 50 units in the AM, and 30 units in PM, and follow up with your doctor tomorrow morning regarding dose adjustment.   If you begin to feel sugar is low in future, eat or drink something immediately, and check sugar.   Return to ER right away if worse, symptoms recur, vomiting, fevers, other concern.

## 2016-05-30 NOTE — ED Notes (Signed)
[facility] called to transport pt home. Pt is unable to get up by herself and uses a walker which she did not bring when she arrived by EMS. Pt also has difficulty getting in the house w/o assistance. Pt roommate is elderly as well

## 2016-05-30 NOTE — ED Notes (Signed)
Pt unable to contact son to pick her up. Pt needs to eat lunch tray per Dr Denton LankSteinl and then she will be d/c home. Attempted to contact son at number in pt chart and phone not accepting any vmails d/t mailbox full.

## 2016-05-30 NOTE — ED Notes (Signed)
She is in no distress and tells us she feels "shaky". Her cbg is normal and we send u/a at this time per her request d/t "full" feeling at bladder area.

## 2016-05-30 NOTE — ED Triage Notes (Signed)
Pt from home her roommate called stating that the pt CBG was low and she had decreased LOC on arrival CBG 40 Tx with D10 iv given next CBG 201 pt was more alert following commands

## 2016-05-30 NOTE — ED Provider Notes (Signed)
[facility]-EMERGENCY DEPT Provider Note   CSN: [id number] Arrival date & time: 05/30/16  0556     History   Chief Complaint Chief Complaint  Patient presents with  . Hypoglycemia    HPI Lauren Thomas is a 81 y.o. female.  Patient w hx iddm presents with low blood sugar. States has been taking her insulin per normal. This AM pt was noted to be confused, and states blood sugar was 40.  EMS gave D10, and mental status improved to baseline, and repeat blood sugar 200. Patient indicates that a couple other nights this past week blood sugar got relatively low for her, in the 60-70 range.   Patient indicates recent poor appetite, and hasnt been eating as much in the evening.  States her doctors have been trying to adjust the doses of some of her other medications, including her xanax and her Parkinsons meds, in the past couple weeks. Patient denies fever or chills. No abd pain. No vomiting or diarrhea.     Hypoglycemia  Associated symptoms: anxiety   Associated symptoms: no shortness of breath and no vomiting     Past Medical History:  Diagnosis Date  . Anxiety   . Arthritis   . Diabetes mellitus without complication (HCC)   . Irregular heart rhythm   . Macular degeneration   . Memory loss     Patient Active Problem List   Diagnosis Date Noted  . Anxiety 05/14/2016  . Hallucinations 05/14/2016  . Memory loss 04/27/2016  . Parkinsonism (HCC) 08/11/2015  . Abnormality of gait 08/11/2015  . Elevated troponin 10/12/2013  . Diabetes mellitus type 2 in obese (HCC) 10/12/2013  . Palpitations 10/12/2013  . Abnormal finding on EKG 10/12/2013  . LUNG NODULE- evaluated 2012 07/17/2010  . HYPERTENSION, BENIGN 01/06/2010  . OBESITY-MORBID 12/31/2009  . CAD, NATIVE VESSEL- mild CAD at cath 2002 and coronary CT 07/11 12/31/2009  . CHEST PAIN-UNSPECIFIED 12/31/2009    Past Surgical History:  Procedure Laterality Date  . ABDOMINAL HYSTERECTOMY    . BACK SURGERY    . CATARACT  EXTRACTION    . CHOLECYSTECTOMY    . LEFT HEART CATHETERIZATION WITH CORONARY ANGIOGRAM N/A 10/15/2013   Procedure: LEFT HEART CATHETERIZATION WITH CORONARY ANGIOGRAM;  Surgeon: Lesleigh NoeHenry W Smith III, MD;  [facility];  Service: Cardiovascular;  Laterality: N/A;    OB History    No data available       Home Medications    Prior to Admission medications   Medication Sig Start Date End Date Taking? Authorizing Provider  carbidopa-levodopa (SINEMET) 25-100 MG tablet Take 2 tablets by mouth 3 (three) times daily. 04/27/16  Yes Levert FeinsteinYijun Yan, MD  diclofenac (VOLTAREN) 75 MG EC tablet Take 75 mg by mouth 2 (two) times daily.  04/10/16  Yes Historical Provider, MD  donepezil (ARICEPT) 10 MG tablet Take 1 tablet (10 mg total) by mouth at bedtime. 04/27/16  Yes Levert FeinsteinYijun Yan, MD  guaiFENesin (MUCINEX) 600 MG 12 hr tablet Take 600 mg by mouth 2 (two) times daily as needed for cough or to loosen phlegm.   Yes Historical Provider, MD  hydroxypropyl methylcellulose (ISOPTO TEARS) 2.5 % ophthalmic solution 1 drop. AS DIRECTED- ALSO KNOWN AS ARTIFICAL TEARS   Yes Historical Provider, MD  hyoscyamine (ANASPAZ) 0.125 MG TBDP disintergrating tablet Place 0.125 mg under the tongue every 6 (six) hours as needed for bladder spasms or cramping.   Yes Historical Provider, MD  LORazepam (ATIVAN) 0.5 MG tablet Take 0.5 mg by  mouth as needed for anxiety.  04/14/16  Yes Historical Provider, MD  losartan (COZAAR) 25 MG tablet Take 25 mg by mouth daily.  04/23/16  Yes Historical Provider, MD  PRESCRIPTION MEDICATION Vaginal cream/wash prescribed from  Urologist Patient did not know the name of it and family has not been able to find it at any pharmacies.   Yes Historical Provider, MD    Family History Family History  Problem Relation Age of Onset  . Heart attack Mother   . Heart disease Mother   . Diabetes Mother   . Prostate cancer Father   . Diabetes Maternal Grandmother   . Heart disease Brother   . Lung cancer  Brother     Social History Social History  Substance Use Topics  . Smoking status: Never Smoker  . Smokeless tobacco: Never Used  . Alcohol use No     Allergies   Erythromycin; Penicillins; Red dye; Sulfonamide derivatives; Latex; Neosporin [neomycin-bacitracin zn-polymyx]; and Polysporin [bacitracin-polymyxin b]   Review of Systems Review of Systems  Constitutional: Negative for fever.  HENT: Negative for sore throat.   Eyes: Negative for visual disturbance.  Respiratory: Negative for cough and shortness of breath.   Cardiovascular: Negative for chest pain.  Gastrointestinal: Negative for abdominal pain, diarrhea and vomiting.  Genitourinary: Negative for dysuria and flank pain.  Musculoskeletal: Negative for back pain and neck pain.  Skin: Negative for rash.  Neurological: Negative for headaches.  Hematological: Does not bruise/bleed easily.  Psychiatric/Behavioral: The patient is nervous/anxious.      Physical Exam Updated Vital Signs BP 164/78 (BP Location: Left Arm)   Pulse 60   Temp 98.3 F (36.8 C) (Oral)   Resp 18   Ht 5\' 7"  (1.702 m)   Wt 108.9 kg   SpO2 99%   BMI 37.59 kg/m   Physical Exam  Constitutional: She appears well-developed and well-nourished. No distress.  HENT:  Mouth/Throat: Oropharynx is clear and moist.  Eyes: Conjunctivae are normal. Pupils are equal, round, and reactive to light. No scleral icterus.  Neck: Neck supple. No tracheal deviation present.  Cardiovascular: Normal rate, regular rhythm, normal heart sounds and intact distal pulses.   Pulmonary/Chest: Effort normal and breath sounds normal. No respiratory distress.  Abdominal: Soft. Normal appearance. She exhibits no distension. There is no tenderness.  Genitourinary:  Genitourinary Comments: No cva tenderness  Musculoskeletal: She exhibits no edema.  Neurological: She is alert.  Speech clear. Motor intact bil.   Skin: Skin is warm and dry. No rash noted. She is not  diaphoretic.  Psychiatric: She has a normal mood and affect.  Nursing note and vitals reviewed.    ED Treatments / Results  Labs (all labs ordered are listed, but only abnormal results are displayed) Results for orders placed or performed during the hospital encounter of 05/30/16  CBC  Result Value Ref Range   WBC 9.4 4.0 - 10.5 K/uL   RBC 4.63 3.87 - 5.11 MIL/uL   Hemoglobin 14.2 12.0 - 15.0 g/dL   HCT 04.541.2 40.936.0 - 81.146.0 %   MCV 89.0 78.0 - 100.0 fL   MCH 30.7 26.0 - 34.0 pg   MCHC 34.5 30.0 - 36.0 g/dL   RDW 91.414.1 78.211.5 - 95.615.5 %   Platelets 191 150 - 400 K/uL  Comprehensive metabolic panel  Result Value Ref Range   Sodium 138 135 - 145 mmol/L   Potassium 5.3 (H) 3.5 - 5.1 mmol/L   Chloride 105 101 - 111 mmol/L  CO2 27 22 - 32 mmol/L   Glucose, Bld 61 (L) 65 - 99 mg/dL   BUN 15 6 - 20 mg/dL   Creatinine, Ser 0.45 0.44 - 1.00 mg/dL   Calcium 8.9 8.9 - 40.9 mg/dL   Total Protein 6.9 6.5 - 8.1 g/dL   Albumin 3.5 3.5 - 5.0 g/dL   AST 27 15 - 41 U/L   ALT 16 14 - 54 U/L   Alkaline Phosphatase 64 38 - 126 U/L   Total Bilirubin 1.5 (H) 0.3 - 1.2 mg/dL   GFR calc non Af Amer >60 >60 mL/min   GFR calc Af Amer >60 >60 mL/min   Anion gap 6 5 - 15  Urinalysis, Routine w reflex microscopic (not at [facility])  Result Value Ref Range   Color, Urine YELLOW YELLOW   APPearance CLEAR CLEAR   Specific Gravity, Urine 1.010 1.005 - 1.030   pH 6.0 5.0 - 8.0   Glucose, UA NEGATIVE NEGATIVE mg/dL   Hgb urine dipstick NEGATIVE NEGATIVE   Bilirubin Urine NEGATIVE NEGATIVE   Ketones, ur NEGATIVE NEGATIVE mg/dL   Protein, ur NEGATIVE NEGATIVE mg/dL   Nitrite NEGATIVE NEGATIVE   Leukocytes, UA NEGATIVE NEGATIVE  POC CBG, ED  Result Value Ref Range   Glucose-Capillary 91 65 - 99 mg/dL   Comment 1 Notify RN    Comment 2 Document in Chart   POC CBG, ED  Result Value Ref Range   Glucose-Capillary 110 (H) 65 - 99 mg/dL  POC CBG, ED  Result Value Ref Range   Glucose-Capillary 93 65 - 99 mg/dL    Dg Sacrum/coccyx  Result Date: 05/14/2016 CLINICAL DATA:  Pain after falling 5 days ago. EXAM: SACRUM AND COCCYX - 2+ VIEW COMPARISON:  None. FINDINGS: There is no evidence of fracture or other focal bone lesions. Pubic symphysis and sacroiliac joints appear intact. IMPRESSION: Negative. Electronically Signed   By: Ellery Plunk M.D.   On: 05/14/2016 00:04    EKG  EKG Interpretation None       Radiology No results found.  Procedures Procedures (including critical care time)  Medications Ordered in ED Medications - No data to display   Initial Impression / Assessment and Plan / ED Course  I have reviewed the triage vital signs and the nursing notes.  Pertinent labs & imaging results that were available during my care of the patient were reviewed by me and considered in my medical decision making (see chart for details).  Clinical Course     Continuous pulse ox and monitor.   Labs.  Patient indicates takes insulin 70/30, 70 units in AM, 50 units in PM.   Patient indicates her normal blood sugar ranges 140-160.  Patient indicates she can generally tell when it gets below 100, and then knows to eat.   Will feed in ED/meal.  Given recent other episodes relatively low glucose, and todays, in setting decreased appetite, will decrease insulin dose, and have f/u closely w pcp.  Recheck, pt indicates feels fine, at baseline. Ate meal tray and snack. Repeat glucose normal, and continues to feel at baseline.  Patient currently appears stable for d/c.     Final Clinical Impressions(s) / ED Diagnoses   Final diagnoses:  None    New Prescriptions New Prescriptions   No medications on file     Cathren Laine, MD 05/30/16 1116

## 2016-05-31 ENCOUNTER — Emergency Department (HOSPITAL_COMMUNITY): Payer: Medicare Other

## 2016-05-31 LAB — PROTIME-INR
INR: 1.05
Prothrombin Time: 13.7 seconds (ref 11.4–15.2)

## 2016-05-31 LAB — APTT: APTT: 36 s (ref 24–36)

## 2016-05-31 LAB — COMPREHENSIVE METABOLIC PANEL
ALT: 20 U/L (ref 14–54)
ANION GAP: 8 (ref 5–15)
AST: 21 U/L (ref 15–41)
Albumin: 3.4 g/dL — ABNORMAL LOW (ref 3.5–5.0)
Alkaline Phosphatase: 74 U/L (ref 38–126)
BUN: 13 mg/dL (ref 6–20)
CHLORIDE: 103 mmol/L (ref 101–111)
CO2: 26 mmol/L (ref 22–32)
CREATININE: 1 mg/dL (ref 0.44–1.00)
Calcium: 9 mg/dL (ref 8.9–10.3)
GFR calc non Af Amer: 52 mL/min — ABNORMAL LOW (ref >60)
Glucose, Bld: 158 mg/dL — ABNORMAL HIGH (ref 65–99)
POTASSIUM: 4.5 mmol/L (ref 3.5–5.1)
SODIUM: 137 mmol/L (ref 135–145)
Total Bilirubin: 0.5 mg/dL (ref 0.3–1.2)
Total Protein: 7 g/dL (ref 6.5–8.1)

## 2016-05-31 MED ORDER — LORAZEPAM 2 MG/ML IJ SOLN
1.0000 mg | Freq: Once | INTRAMUSCULAR | Status: AC | PRN
  Administered 2016-05-31: 1 mg via INTRAVENOUS
  Filled 2016-05-31: qty 1

## 2016-05-31 NOTE — ED Notes (Signed)
Pt transported to MRI 

## 2016-05-31 NOTE — Discharge Instructions (Signed)
All the results in the ER are normal, labs and imaging. We are not sure what is causing your symptoms. The workup in the ER is not complete, and is limited to screening for life threatening and emergent conditions only, so please see a primary care doctor for further evaluation.  

## 2016-05-31 NOTE — ED Notes (Signed)
Pt in MRI, unable to do 4:00 neuro check.

## 2016-05-31 NOTE — ED Notes (Signed)
Patient transported to CT 

## 2016-05-31 NOTE — ED Notes (Signed)
Pt returned from MRI and connected to the monitor 

## 2016-06-01 ENCOUNTER — Telehealth: Payer: Self-pay | Admitting: Surgery

## 2016-06-01 NOTE — Telephone Encounter (Signed)
ED CM received in-basket message to follow up with patient concerning HH medication management . Attempted to contact patient no answer unable to leave message on voicemail. CM will make second attempt tomorrow 11/22. ED CM will follow up

## 2016-06-11 ENCOUNTER — Telehealth: Payer: Self-pay | Admitting: Neurology

## 2016-06-11 NOTE — Telephone Encounter (Signed)
Pt's husband called to advise he has noticed some significant changes. He is wanting to know if she should be seen sooner than 1/16. Please call

## 2016-06-14 NOTE — Telephone Encounter (Signed)
Called her husband, Link Snufferddie (at the home number listed) - states her memory has further declined and she is starting to hallucinate.  He would like her to be seen this week.  She has been placed on Megan's schedule.

## 2016-06-14 NOTE — Telephone Encounter (Signed)
Returned call to her son, Sherrill RaringRuss, at the number provided below - no answer and voicemail is full.

## 2016-06-15 ENCOUNTER — Encounter: Payer: Self-pay | Admitting: Adult Health

## 2016-06-15 ENCOUNTER — Ambulatory Visit (INDEPENDENT_AMBULATORY_CARE_PROVIDER_SITE_OTHER): Payer: Medicare Other | Admitting: Adult Health

## 2016-06-15 VITALS — BP 117/63 | HR 63 | Ht 67.0 in | Wt 232.0 lb

## 2016-06-15 DIAGNOSIS — R443 Hallucinations, unspecified: Secondary | ICD-10-CM

## 2016-06-15 DIAGNOSIS — R413 Other amnesia: Secondary | ICD-10-CM | POA: Diagnosis not present

## 2016-06-15 DIAGNOSIS — G20 Parkinson's disease: Secondary | ICD-10-CM | POA: Diagnosis not present

## 2016-06-15 DIAGNOSIS — R269 Unspecified abnormalities of gait and mobility: Secondary | ICD-10-CM

## 2016-06-15 NOTE — Progress Notes (Signed)
PATIENT: CLOTIEL TROOP DOB: 05-04-1935  REASON FOR VISIT: follow up HISTORY FROM: patient  HISTORY OF PRESENT ILLNESS: HISTORY 04/27/16: [name] she is a 81 years old right-handed female, accompanied by her husband [name] and her house mate [name], seen in refer by her primary care physician [name] on Aug 11 2015 for evaluation of memory loss.  She had history of insulin dependent diabetes since 1997, macular degeneration, with poor vision, she could nolonge read newpaer, could not tell features of face.  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  She has no family history of dementia, she denies visual hallucinations  UPDATE Oct 17th 2017: Reviewed laboratory evaluation in 2017, low normal B12 222, normal CBC, ESR 20, thyroid panel, negative RPR,  We have personally reviewed MRI brain on Apr 23 2016: mild atrophy, supratentorium disease.  She complains of visual hallucinations, people at the backyard for yard sale, she woke up few times at night, she could not read any longer, her vision is bad, she could not watch TV, "I have to invent for something to be at corner".  She is mentally busy, calling people.    She is now taking Sinemet 25/100 mg 1 tablet 3 times a day, which help her walking is on  Today 06/15/2016:  Mr. Peine is a 81 year old female with a history of Parkinson's disease and memory disturbance. She returns today for evaluation of her memory. She feels that  her memory has gotten worse. She reports that she has trouble remembering phone numbers. Her ex-husband [name] is with her and states that she also has a hard time expressing herself. She lives with her house mate-- Eduard Clos. He also has medical issues. She does require assistance with ADLs. This is not due to her memory but rather physical limitations. [name] handles her finances. She no longer cooks meals. She states due to her macular degeneration she is unable to cook. Her and [name] primarily have take out. She continues to have hallucinations. She is on nuplazid but has not seen the benefit of this medication. She does see Dr. Casimiro Needle  for the hallucinations. She reports that her typical hallucinations- she sees people on her back porch. She reports that they are sometimes fearful. As far as the Parkinson's she feels that her symptoms have remained stable. She does feel like she has decreased strength in the lower extremities. She was referred for physical therapy however she has not done this. She states occasionally she has trouble sleeping due to the hallucinations. Denies any trouble swallowing. She uses a walker when ambulating. She is currently on Aricept 10 mg for her memory. [name] does have to help her with her medications. She reports today she feels very weak in the upper extremities. She is also very shaky. Reports that she is only has eaten half an oatmeal cookie all day. She returns today for an evaluation.  REVIEW OF SYSTEMS: Out of a complete 14 system review of symptoms, the patient complains only  of the following symptoms, and all other reviewed systems are negative.  Activity change, appetite change, chills, fatigue, hearing loss, drooling, loss of vision, flushing, constipation, rectal pain, frequent waking, daytime sleepiness, aching muscles, muscle cramps, walking difficulty, neck pain, neck stiffness, moles, agitation, confusion, depression, nervous/anxious, hallucinations, weakness,  headache, memory loss  ALLERGIES: Allergies  Allergen Reactions  . Erythromycin     Heart flutter  . Penicillins     Patient has no idea what her reaction is   . Red Dye   . Sulfonamide Derivatives     diarrhea  . Latex Rash  . Neosporin [Neomycin-Bacitracin Zn-Polymyx] Rash  . Polysporin [Bacitracin-Polymyxin B] Rash    HOME MEDICATIONS: Outpatient Medications Prior to Visit  Medication Sig Dispense Refill  . carbidopa-levodopa (SINEMET) 25-100 MG tablet Take 2 tablets by mouth 3 (three) times daily. 180 tablet 6  . diclofenac (VOLTAREN) 75 MG EC tablet Take 75 mg by mouth 2 (two) times daily.   0  . donepezil (ARICEPT) 10 MG tablet Take 1 tablet (10 mg total) by mouth at bedtime. 30 tablet 11  . guaiFENesin (MUCINEX) 600 MG 12 hr tablet Take 600 mg by mouth 2 (two) times daily as needed for cough or to loosen phlegm.    . hydroxypropyl methylcellulose (ISOPTO TEARS) 2.5 % ophthalmic solution 1 drop. AS DIRECTED- ALSO KNOWN AS ARTIFICAL TEARS    . hyoscyamine (ANASPAZ) 0.125 MG TBDP disintergrating tablet Place 0.125 mg under the tongue every 6 (six) hours as needed for bladder spasms or cramping.    Marland Kitchen LORazepam (ATIVAN) 0.5 MG tablet Take 0.5 mg by mouth as needed for anxiety.   3  . losartan (COZAAR) 25 MG tablet Take 25 mg by mouth daily.     Marland Kitchen PRESCRIPTION MEDICATION Vaginal cream/wash prescribed from  Urologist Patient did not know the name of it and family has not been able to find it at any pharmacies.     No facility-administered medications prior to visit.     PAST MEDICAL HISTORY: Past Medical History:  Diagnosis Date  . Anxiety   . Arthritis   . Diabetes mellitus without complication ([place])   . Irregular heart rhythm   . Macular degeneration   . Memory loss     PAST SURGICAL HISTORY: Past Surgical History:  Procedure Laterality Date  . ABDOMINAL HYSTERECTOMY    . BACK SURGERY    . CATARACT EXTRACTION    . CHOLECYSTECTOMY    . LEFT HEART CATHETERIZATION  WITH CORONARY ANGIOGRAM N/A 10/15/2013   Procedure: LEFT HEART CATHETERIZATION WITH CORONARY ANGIOGRAM;  Surgeon: Sinclair Grooms, MD;  Location: [facility] CATH LAB;  Service: Cardiovascular;  Laterality: N/A;    FAMILY HISTORY: Family History  Problem Relation Age of Onset  . Heart attack Mother   . Heart disease Mother   . Diabetes Mother   . Prostate cancer Father   . Diabetes Maternal Grandmother   . Heart disease Brother   . Lung cancer Brother     SOCIAL HISTORY: Social History   Social History  . Marital status: Legally Separated    Spouse name: N/A  . Number of children: 3  . Years of education: BA   Occupational History  . Retired    Social History Main Topics  . Smoking status: Never Smoker  . Smokeless tobacco: Never Used  . Alcohol use No  . Drug use: No  . Sexual activity: Not on file   Other Topics Concern  . Not on  file   Social History Narrative   Lives at home with her friend, [name].   Right-handed.   2-3 cups caffeine daily.      PHYSICAL EXAM  Vitals:   06/15/16 1447  Weight: 232 lb (105.2 kg)  Height: 5' 7"  (1.702 m)   Body mass index is 36.34 kg/m.   MMSE - Mini Mental State Exam 06/15/2016 04/27/2016  Orientation to time 4 4  Orientation to Place 5 5  Registration 3 3  Attention/ Calculation 4 5  Recall 1 3  Language- name 2 objects 2 2  Language- repeat 1 1  Language- follow 3 step command 3 3  Language- read & follow direction 0 1  Write a sentence 1 1  Copy design 1 0  Total score 25 28     Generalized: Well developed, in no acute distress   Neurological examination  Mentation: Alert. Follows all commands speech and language fluent Cranial nerve II-XII: Pupils were equal round reactive to light. Extraocular movements were full, visual field were full on confrontational test. Facial sensation and strength were normal. Uvula tongue midline. Head turning and shoulder shrug  were normal and symmetric. Motor: The motor  testing reveals 5 over 5 strength of all 4 extremities. Good symmetric motor tone is noted throughout.  Sensory: Sensory testing is intact to soft touch on all 4 extremities. No evidence of extinction is noted.  Coordination: Cerebellar testing reveals good finger-nose-finger and heel-to-shin bilaterally.  Gait and station: Patient uses a walker to ambulate. Reflexes: Deep tendon reflexes are symmetric and normal bilaterally.   DIAGNOSTIC DATA (LABS, IMAGING, TESTING) - I reviewed patient records, labs, notes, testing and imaging myself where available.  Lab Results  Component Value Date   WBC 10.1 05/30/2016   HGB 13.6 05/30/2016   HCT 40.0 05/30/2016   MCV 88.9 05/30/2016   PLT 200 05/30/2016      Component Value Date/Time   NA 136 05/30/2016 2340   K 4.5 05/30/2016 2340   CL 104 05/30/2016 2340   CO2 26 05/30/2016 2324   GLUCOSE 151 (H) 05/30/2016 2340   BUN 14 05/30/2016 2340   CREATININE 0.90 05/30/2016 2340   CALCIUM 9.0 05/30/2016 2324   PROT 7.0 05/30/2016 2324   ALBUMIN 3.4 (L) 05/30/2016 2324   AST 21 05/30/2016 2324   ALT 20 05/30/2016 2324   ALKPHOS 74 05/30/2016 2324   BILITOT 0.5 05/30/2016 2324   GFRNONAA 52 (L) 05/30/2016 2324   GFRAA >60 05/30/2016 2324   Lab Results  Component Value Date   CHOL 155 10/13/2013   HDL 29 (L) 10/13/2013   LDLCALC 87 10/13/2013   TRIG 194 (H) 10/13/2013   CHOLHDL 5.3 10/13/2013   No results found for: HGBA1C Lab Results  Component Value Date   VITAMINB12 220 08/11/2015   Lab Results  Component Value Date   TSH 0.982 08/11/2015      ASSESSMENT AND PLAN 81 y.o. year old female  has a past medical history of Anxiety; Arthritis; Diabetes mellitus without complication ([place]); Irregular heart rhythm; Macular degeneration; and Memory loss. here with:  1. Parkinson's disease 2. Memory disturbance 3. Hallucinations  The patient's memory has slightly declined. Her MMSE today is 25/30 was previously 28/30. She will  continue on Aricept. We did discuss starting Namenda however she does not want to start any new medication at this time. I advised that she should follow-up with Dr. Loralee Pacas in regards to her ongoing hallucinations. She voiced understanding. She  will continue on Sinemet 25-100 milligrams 2 tablets 3 times a day. I will follow up on the patient's referral for physical therapy. I have advised the patient that I do feel that she needs more assistance in the home and for that reason she should consider possible assisted living. I have provided her the number to care patrol to help with resources that may be available to her. She will follow-up in January with Dr. Krista Blue.  Her blood sugar today in office was 90. She was given crackers and apple juice.     Ward Givens, MSN, NP-C 06/15/2016, 3:06 PM [facility] [address], [place], [place] [address] [phone]

## 2016-06-15 NOTE — Patient Instructions (Signed)
Memory score slightly declined Follow-up with Dr. Donell BeersPlovsky for Hallucinations Continue Aricept May need to consider adding namenda If your symptoms worsen or you develop new symptoms please let us know.

## 2016-06-16 NOTE — Progress Notes (Signed)
I have reviewed and agreed above plan. 

## 2016-06-28 ENCOUNTER — Encounter: Payer: Self-pay | Admitting: *Deleted

## 2016-06-28 ENCOUNTER — Telehealth: Payer: Self-pay | Admitting: Neurology

## 2016-06-28 NOTE — Telephone Encounter (Signed)
Patient's husband is calling. Over the weekend the patient ws in a psychotic mood. He called the doctor on call and the doctor advised the patient to cut back carbidopa-levodopa (SINEMET) 25-100 MG tablet to half the dosage. The patient seems better with less of a dosage and wants to know if she should continue the lower dasage.

## 2016-06-28 NOTE — Telephone Encounter (Signed)
Returned call to pt's husband - says he spoke with the on call MD this past weekend.  He has reduced pt's Sinemet 25-100mg  to one tab TID.  Says her mood has improved and her symptoms are still well controlled.  Ok, per Dr. Terrace ArabiaYan, for her to continue the lower dosage.  Mr. Lauren Thomas expressed understanding to call our office for any change in condition.

## 2016-07-14 ENCOUNTER — Ambulatory Visit (HOSPITAL_COMMUNITY): Payer: Medicare Other | Admitting: Psychiatry

## 2016-07-24 ENCOUNTER — Telehealth: Payer: Self-pay | Admitting: Neurology

## 2016-07-24 MED ORDER — QUETIAPINE FUMARATE 25 MG PO TABS: 25.0000 mg | ORAL_TABLET | Freq: Every day | ORAL | 1 refills | Status: DC

## 2016-07-24 NOTE — Telephone Encounter (Signed)
I called the husband. The patient is having hallucinations throughout the day. She is on Nupalzid. She has been on this for 4 weeks, but she is still having problems. Will place her on Seroquel at night until the Nuplazid take effect.

## 2016-07-27 ENCOUNTER — Ambulatory Visit (INDEPENDENT_AMBULATORY_CARE_PROVIDER_SITE_OTHER): Payer: Medicare Other | Admitting: Neurology

## 2016-07-27 ENCOUNTER — Encounter: Payer: Self-pay | Admitting: Neurology

## 2016-07-27 VITALS — BP 101/59 | HR 68 | Ht 67.0 in | Wt 225.8 lb

## 2016-07-27 DIAGNOSIS — R269 Unspecified abnormalities of gait and mobility: Secondary | ICD-10-CM

## 2016-07-27 DIAGNOSIS — R413 Other amnesia: Secondary | ICD-10-CM

## 2016-07-27 DIAGNOSIS — R441 Visual hallucinations: Secondary | ICD-10-CM

## 2016-07-27 DIAGNOSIS — F419 Anxiety disorder, unspecified: Secondary | ICD-10-CM

## 2016-07-27 DIAGNOSIS — G20 Parkinson's disease: Secondary | ICD-10-CM

## 2016-07-27 MED ORDER — QUETIAPINE FUMARATE 25 MG PO TABS: 25.0000 mg | ORAL_TABLET | Freq: Every day | ORAL | 6 refills | Status: DC

## 2016-07-27 NOTE — Progress Notes (Signed)
PATIENT: Lauren Thomas DOB: March 06, 1935  REASON FOR VISIT: follow up HISTORY FROM: patient  HISTORY OF PRESENT ILLNESS: HISTORY 04/27/16: Lauren Thomas she is a 82 years old right-handed female, accompanied by her husband Lauren Thomas and her house mate Lauren Thomas, seen in refer by her primary care physician Lauren Thomas on Aug 11 2015 for evaluation of memory loss.  She had history of insulin dependent diabetes since 1997, macular degeneration, with poor vision, she could no longer read newpaer, could not tell features of face.  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  She has no family history of dementia, she denies visual hallucinations  UPDATE Oct 17th 2017: Reviewed laboratory evaluation in 2017, low normal B12 222, normal CBC, ESR 20, thyroid panel, negative RPR,  We have personally reviewed MRI brain on Apr 23 2016: mild atrophy, supratentorium disease.  She complains of visual hallucinations, people at the backyard for yard sale, she woke up few times at night, she could not read any longer, her vision is bad, she could not watch TV, "I have to invent for something to be at corner".  She is mentally busy, calling people.    She is now taking Sinemet 25/100 mg 1 tablet 3 times a day, which help her walking  She lives with her house mate-- Lauren Thomas. He also has medical issues. She does require assistance with ADLs. This is not due to her memory but rather physical limitations. Her ex-husband  Lauren Thomas handles her finances. She no longer cooks meals. She states due to her macular degeneration she is unable to cook. Her and Lauren Thomas primarily have take out. She continues to have hallucinations. She is on nuplazid but has not seen the benefit of this medication. She does see Lauren Thomas  for the hallucinations. She reports that her typical hallucinations- she sees people on her back porch. She reports that they are sometimes fearful.   As far as the Parkinson's she feels that her symptoms have remained stable. She does feel like she has decreased strength in the lower extremities. She was referred for physical therapy however she has not done this. She states occasionally she has trouble sleeping due to the hallucinations. Denies any trouble swallowing. She uses a walker when ambulating. She is currently on Aricept 10 mg for her memory. Lauren Thomas does have to help her with her medications. She reports today she feels very weak in the upper extremities. She is also very shaky. Reports that she is only has eaten half an oatmeal cookie all day. She returns today for an evaluation.  UPDATE Jan 16th 2018: She came in with her extended family, 2 sons, and her ex-husband, her house mate,  She continue have mild memory trouble, gait abnormality, the most disturbing symptoms are her increased visual hallucinations, she saw people sitting at her porch, sometimes she trying to close the door to avoid people getting to her house.  Today's Mini-Mental status 26/27, because of poor vision,  We have personally reviewed MRI  of the brain November 2017, no acute abnormality, generalized atrophy  REVIEW OF SYSTEMS: Out of a complete 14 system review of symptoms, the patient complains only of the following symptoms, and all other reviewed systems are negative. As above ALLERGIES: Allergies  Allergen Reactions  . Erythromycin     Heart flutter  . Penicillins     Patient has no idea what her reaction is   . Red  Dye   . Sulfonamide Derivatives     diarrhea  . Latex Rash  . Neosporin [Neomycin-Bacitracin Zn-Polymyx] Rash  . Polysporin [Bacitracin-Polymyxin B] Rash    HOME MEDICATIONS: Outpatient Medications Prior to Visit  Medication Sig Dispense Refill  . carbidopa-levodopa (SINEMET) 25-100 MG tablet Take 2 tablets by mouth 3 (three) times daily. 180 tablet 6  . diclofenac (VOLTAREN) 75 MG EC tablet Take 75 mg by mouth 2 (two) times daily.   0  . donepezil (ARICEPT) 10 MG tablet Take 1 tablet (10 mg total) by mouth at bedtime. 30 tablet 11  . guaiFENesin (MUCINEX) 600 MG 12 hr tablet Take 600 mg by mouth 2 (two) times daily as needed for cough or to loosen phlegm.    . hydroxypropyl methylcellulose (ISOPTO TEARS) 2.5 % ophthalmic solution 1 drop. AS DIRECTED- ALSO KNOWN AS ARTIFICAL TEARS    . hyoscyamine (ANASPAZ) 0.125 MG TBDP disintergrating tablet Place 0.125 mg under the tongue every 6 (six) hours as needed for bladder spasms or cramping.    Marland Kitchen LORazepam (ATIVAN) 0.5 MG tablet Take 0.5 mg by mouth as needed for anxiety.   3  . losartan (COZAAR) 25 MG tablet Take 25 mg by mouth daily.     Marland Kitchen Pimavanserin Tartrate (NUPLAZID) 17 MG TABS Take by mouth 2 (two) times daily.    Marland Kitchen PRESCRIPTION MEDICATION Vaginal cream/wash prescribed from  Urologist Patient did not know the name of it and family has not been able to find it at any pharmacies.    Marland Kitchen QUEtiapine (SEROQUEL) 25 MG tablet Take 1 tablet (25 mg total) by mouth at bedtime. 30 tablet 1   No facility-administered medications prior to visit.     PAST MEDICAL HISTORY: Past Medical History:  Diagnosis Date  . Anxiety   . Arthritis   . Diabetes mellitus without complication ([place])   . Irregular heart rhythm   . Macular degeneration   . Memory loss     PAST SURGICAL HISTORY: Past Surgical History:  Procedure Laterality Date  . ABDOMINAL HYSTERECTOMY    . BACK SURGERY    . CATARACT EXTRACTION    . CHOLECYSTECTOMY    . LEFT HEART  CATHETERIZATION WITH CORONARY ANGIOGRAM N/A 10/15/2013   Procedure: LEFT HEART CATHETERIZATION WITH CORONARY ANGIOGRAM;  Surgeon: Sinclair Grooms, MD;  Location: [facility] CATH LAB;  Service: Cardiovascular;  Laterality: N/A;    FAMILY HISTORY: Family History  Problem Relation Age of Onset  . Heart attack Mother   . Heart disease Mother   . Diabetes Mother   . Prostate cancer Father   . Diabetes Maternal Grandmother   . Heart disease Brother   . Lung cancer Brother     SOCIAL HISTORY: Social History   Social History  . Marital status: Legally Separated    Spouse name: N/A  . Number of children: 3  . Years of education: BA   Occupational History  . Retired    Social History Main Topics  . Smoking status: Never Smoker  . Smokeless tobacco: Never Used  .  Alcohol use No  . Drug use: No  . Sexual activity: Not on file   Other Topics Concern  . Not on file   Social History Narrative   Lives at home with her friend, [name].   Right-handed.   2-3 cups caffeine daily.      PHYSICAL EXAM  Vitals:   07/27/16 1321  BP: (!) 101/59  Pulse: 68  Weight: 225 lb 12 oz (102.4 kg)  Height: 5' 7"  (1.702 m)   Body mass index is 35.36 kg/m.    PHYSICAL EXAMNIATION:  Gen: NAD, conversant, well nourised, obese, well groomed                     Cardiovascular: Regular rate rhythm, no peripheral edema, warm, nontender. Eyes: Conjunctivae clear without exudates or hemorrhage Neck: Supple, no carotid bruits. Pulmonary: Clear to auscultation bilaterally   NEUROLOGICAL EXAM:  MENTAL STATUS: Speech:    Speech is normal; fluent and spontaneous with normal comprehension.  Cognition: Mini-Mental Status Examination 26/27, animal naming 8     Orientation to time, place and person     Normal recent and remote memory     Normal Attention span and concentration     Normal Language, naming, repeating,spontaneous speech     Fund of knowledge   CRANIAL NERVES: CN II: Pupils are  round equal and briskly reactive to light. Both eyes visual acuity 20/200  CN III, IV, VI: extraocular movement are normal. No ptosis. CN V: Facial sensation is intact to pinprick in all 3 divisions bilaterally. Corneal responses are intact.  CN VII: Face is symmetric with normal eye closure and smile. CN VIII: Hearing is normal to rubbing fingers CN IX, X: Palate elevates symmetrically. Phonation is normal. CN XI: Head turning and shoulder shrug are intact CN XII: Tongue is midline with normal movements and no atrophy.  MOTOR: Mild bilateral limb and nuchal rigidity, left worse than right, increased with reinforcement maneuvers. She has no significant weakness.   REFLEXES: Reflexes are 2+ and symmetric at the biceps, triceps, knees, and ankles. Plantar responses are flexor.  SENSORY: Intact to light touch, pinprick, positional and vibratory sensation are intact in fingers and toes.  COORDINATION: Rapid alternating movements and fine finger movements are intact. There is no dysmetria on finger-to-nose and heel-knee-shin.    GAIT/STANCE: She needs assistance to get up from seated position, cautious, mildly unsteady   DIAGNOSTIC DATA (LABS, IMAGING, TESTING) - I reviewed patient records, labs, notes, testing and imaging myself where available.  Lab Results  Component Value Date   WBC 10.1 05/30/2016   HGB 13.6 05/30/2016   HCT 40.0 05/30/2016   MCV 88.9 05/30/2016   PLT 200 05/30/2016      Component Value Date/Time   NA 136 05/30/2016 2340   K 4.5 05/30/2016 2340   CL 104 05/30/2016 2340   CO2 26 05/30/2016 2324   GLUCOSE 151 (H) 05/30/2016 2340   BUN 14 05/30/2016 2340   CREATININE 0.90 05/30/2016 2340   CALCIUM 9.0 05/30/2016 2324   PROT 7.0 05/30/2016 2324   ALBUMIN 3.4 (L) 05/30/2016 2324   AST 21 05/30/2016 2324   ALT 20 05/30/2016 2324   ALKPHOS 74 05/30/2016 2324   BILITOT 0.5 05/30/2016 2324   GFRNONAA 52 (L) 05/30/2016 2324   GFRAA >60 05/30/2016 2324    Lab Results  Component Value Date   CHOL 155 10/13/2013   HDL 29 (L) 10/13/2013   LDLCALC 87 10/13/2013  TRIG 194 (H) 10/13/2013   CHOLHDL 5.3 10/13/2013   No results found for: HGBA1C Lab Results  Component Value Date   VITAMINB12 220 08/11/2015   Lab Results  Component Value Date   TSH 0.982 08/11/2015      ASSESSMENT AND PLAN 82 y.o. year old female  Central nervous system degenerative disorder, possible Lewy body dementia  Visual hallucinations,   Limited response to Nuplazid, keep current dose of 17 mg twice a day  Start Seroquel 16m qhs.  Continue moderate exercise   Polypharmacy treatment  Went over each medication with patient and her family, suggested her stop guaifenesin, Sinemet use, it did not improve her mobility  YMarcial Pacas M.D. Ph.D.  [facility] [address] [place] [address]Phone: [phone]:      [phone]

## 2016-07-27 NOTE — Patient Instructions (Addendum)
She is now taking Sinemet 25/100 mg 2 tablets 3 times a day at 10:30am, 10pm  Taper off sinemet  One tab at 1030pm, one tab at 2 pm x one week,  Then stop  Stop Aricept  Start Seroquel 25mg  one tab every night

## 2016-08-16 ENCOUNTER — Other Ambulatory Visit: Payer: Self-pay | Admitting: Neurology

## 2016-08-16 ENCOUNTER — Other Ambulatory Visit: Payer: Self-pay | Admitting: *Deleted

## 2016-08-16 ENCOUNTER — Telehealth: Payer: Self-pay | Admitting: Neurology

## 2016-08-16 MED ORDER — QUETIAPINE FUMARATE 25 MG PO TABS: 25.0000 mg | ORAL_TABLET | Freq: Two times a day (BID) | ORAL | 1 refills | Status: DC

## 2016-08-16 NOTE — Telephone Encounter (Signed)
Pt's husband called said Dr Jeannie FendY increased QUEtiapine (SEROQUEL) 25 MG tablet bid, she has ran out due to change in directions. [facility] [facility] updated [facility]. Pt took last pill this morning.

## 2016-08-16 NOTE — Telephone Encounter (Signed)
Spoke to patient's husband who states she has been taking Seroquel 25mg , BID since her last appt on 07/27/16.  States the increased was discussed.  He feels she is doing well on this dose.  Ok, per vo by Dr. Epimenio FootSater, to send in new prescription for the BID dosing.  The patient will keep her pending appt w/ Dr. Terrace ArabiaYan on 09/30/16.

## 2016-08-16 NOTE — Telephone Encounter (Signed)
Left message for a return call

## 2016-08-16 NOTE — Telephone Encounter (Signed)
Patient's husband returning your call.

## 2016-08-21 ENCOUNTER — Encounter (HOSPITAL_COMMUNITY): Payer: Self-pay | Admitting: Emergency Medicine

## 2016-08-21 ENCOUNTER — Emergency Department (HOSPITAL_COMMUNITY)
Admission: EM | Admit: 2016-08-21 | Discharge: 2016-08-22 | Disposition: A | Discharge status: Home / Self Care | Payer: Medicare Other | Attending: Emergency Medicine | Admitting: Emergency Medicine

## 2016-08-21 DIAGNOSIS — Y939 Activity, unspecified: Secondary | ICD-10-CM | POA: Diagnosis not present

## 2016-08-21 DIAGNOSIS — T50901A Poisoning by unspecified drugs, medicaments and biological substances, accidental (unintentional), initial encounter: Secondary | ICD-10-CM

## 2016-08-21 DIAGNOSIS — Y92009 Unspecified place in unspecified non-institutional (private) residence as the place of occurrence of the external cause: Secondary | ICD-10-CM | POA: Diagnosis not present

## 2016-08-21 DIAGNOSIS — N179 Acute kidney failure, unspecified: Secondary | ICD-10-CM | POA: Insufficient documentation

## 2016-08-21 DIAGNOSIS — Y638 Failure in dosage during other surgical and medical care: Secondary | ICD-10-CM | POA: Insufficient documentation

## 2016-08-21 DIAGNOSIS — R930 Abnormal findings on diagnostic imaging of skull and head, not elsewhere classified: Secondary | ICD-10-CM | POA: Diagnosis not present

## 2016-08-21 DIAGNOSIS — W19XXXA Unspecified fall, initial encounter: Secondary | ICD-10-CM

## 2016-08-21 DIAGNOSIS — E119 Type 2 diabetes mellitus without complications: Secondary | ICD-10-CM | POA: Insufficient documentation

## 2016-08-21 DIAGNOSIS — E875 Hyperkalemia: Secondary | ICD-10-CM | POA: Diagnosis not present

## 2016-08-21 DIAGNOSIS — T383X1A Poisoning by insulin and oral hypoglycemic [antidiabetic] drugs, accidental (unintentional), initial encounter: Secondary | ICD-10-CM | POA: Diagnosis not present

## 2016-08-21 DIAGNOSIS — Y999 Unspecified external cause status: Secondary | ICD-10-CM | POA: Diagnosis not present

## 2016-08-21 DIAGNOSIS — W1830XA Fall on same level, unspecified, initial encounter: Secondary | ICD-10-CM | POA: Diagnosis not present

## 2016-08-21 DIAGNOSIS — Z79899 Other long term (current) drug therapy: Secondary | ICD-10-CM | POA: Insufficient documentation

## 2016-08-21 NOTE — ED Triage Notes (Signed)
EMS was called b/c patient fell after taking too much medication. Unsure of the amount taken but drugs were ativan and seroquel. Was not an intentional overdose. Pt states she got behind on medications and was trying to catch up. Pt is A+O

## 2016-08-21 NOTE — ED Provider Notes (Signed)
TIME SEEN: 1154  CHIEF COMPLAINT: Accidental drug overdose  HPI: HPI Comments:  Lauren Thomas is a 82 y.o. female with history of diabetes and hypertension who presents to the ED for evaluation following an accidental drug overdose. According to the patient who gives history, "the paramedics think she overdosed on her insulin". EMS reports a fall at home. When asking the patient, she states "I must have gotten back up because I cannot remember any fall". She is only reporting "R sciatic nerve pain" which is reportedly an ongoing problem for her. She denies any head, chest, or abdominal pain currently. Denies any dyspnea. She is reporting a cough with yellow sputum "for 6 months". Denies fevers. Denies any nausea, vomiting, or diarrhea. She denies any sucidiality or attempt of self-harm tonight. She states that "her former husband" helps administer her medication; however he does not live with her apparently. States that someone named [name] lives with her. She does not think that she is on narcotics currently. States she does take Xanax and Seroquel. She is not sure if she took too much of this medication.  ROS: See HPI Constitutional: no fever  Eyes: no drainage  ENT: no runny nose   Cardiovascular:  no chest pain  Resp: no SOB + chronic cough.   GI: no vomiting, diarrhea GU: no dysuria Integumentary: no rash  Allergy: no hives  Musculoskeletal: no leg swelling + R leg pain per HPI Neurological: no slurred speech ROS otherwise negative  PAST MEDICAL HISTORY/PAST SURGICAL HISTORY:  Past Medical History:  Diagnosis Date  . Anxiety   . Arthritis   . Diabetes mellitus without complication ([facility])   . Irregular heart rhythm   . Macular degeneration   . Memory loss     MEDICATIONS:  Prior to Admission medications   Medication Sig Start Date End Date Taking? Authorizing Provider  carbidopa-levodopa (SINEMET) 25-100 MG tablet Take 2 tablets by mouth 3 (three) times daily. 04/27/16    Levert FeinsteinYijun Yan, MD  diclofenac (VOLTAREN) 75 MG EC tablet Take 75 mg by mouth 2 (two) times daily.  04/10/16   Historical Provider, MD  donepezil (ARICEPT) 10 MG tablet Take 1 tablet (10 mg total) by mouth at bedtime. 04/27/16   Levert FeinsteinYijun Yan, MD  guaiFENesin (MUCINEX) 600 MG 12 hr tablet Take 600 mg by mouth 2 (two) times daily as needed for cough or to loosen phlegm.    Historical Provider, MD  hydroxypropyl methylcellulose (ISOPTO TEARS) 2.5 % ophthalmic solution 1 drop. AS DIRECTED- ALSO KNOWN AS ARTIFICAL TEARS    Historical Provider, MD  hyoscyamine (ANASPAZ) 0.125 MG TBDP disintergrating tablet Place 0.125 mg under the tongue every 6 (six) hours as needed for bladder spasms or cramping.    Historical Provider, MD  LORazepam (ATIVAN) 0.5 MG tablet Take 0.5 mg by mouth as needed for anxiety.  04/14/16   Historical Provider, MD  losartan (COZAAR) 25 MG tablet Take 25 mg by mouth daily.  04/23/16   Historical Provider, MD  Pimavanserin Tartrate (NUPLAZID) 17 MG TABS Take by mouth 2 (two) times daily.    Historical Provider, MD  PRESCRIPTION MEDICATION Vaginal cream/wash prescribed from  Urologist Patient did not know the name of it and family has not been able to find it at any pharmacies.    Historical Provider, MD  QUEtiapine (SEROQUEL) 25 MG tablet TAKE 1 TABLET(25 MG) BY MOUTH TWICE DAILY 08/17/16   Asa Lenteichard A Sater, MD    ALLERGIES:  Allergies  Allergen Reactions  .  Erythromycin     Heart flutter  . Penicillins     Patient has no idea what her reaction is   . Red Dye   . Sulfonamide Derivatives     diarrhea  . Latex Rash  . Neosporin [Neomycin-Bacitracin Zn-Polymyx] Rash  . Polysporin [Bacitracin-Polymyxin B] Rash    SOCIAL HISTORY:  Social History  Substance Use Topics  . Smoking status: Never Smoker  . Smokeless tobacco: Never Used  . Alcohol use No    FAMILY HISTORY: Family History  Problem Relation Age of Onset  . Heart attack Mother   . Heart disease Mother   . Diabetes  Mother   . Prostate cancer Father   . Diabetes Maternal Grandmother   . Heart disease Brother   . Lung cancer Brother     EXAM: BP 137/92   Pulse 75   Temp 97.4 F (36.3 C) (Axillary)   Resp 13   SpO2 100%  CONSTITUTIONAL: Alert and oriented x3 and responds appropriately to questions. Elderly, chronically ill-appearing; obese; no apparent distress; GCS 15, Drowsy but arousable HEAD: Normocephalic; atraumatic EYES: Conjunctivae clear, PERRL, EOMI ENT: normal nose; no rhinorrhea; moist mucous membranes; pharynx without lesions noted; no dental injury; no septal hematoma NECK: Supple, no meningismus, no LAD; no midline spinal tenderness, step-off or deformity; trachea midline CARD: RRR; S1 and S2 appreciated; no murmurs, no clicks, no rubs, no gallops RESP: Normal chest excursion without splinting or tachypnea; breath sounds clear and equal bilaterally; no wheezes, no rhonchi, no rales; no hypoxia or respiratory distress CHEST:  chest wall stable, no crepitus or ecchymosis or deformity, nontender to palpation; no flail chest ABD/GI: Normal bowel sounds; non-distended; soft, non-tender, no rebound, no guarding; no ecchymosis or other lesions noted PELVIS:  stable, nontender to palpation BACK:  The back appears normal and is non-tender to palpation, there is no CVA tenderness; no midline spinal tenderness, step-off or deformity EXT: Normal ROM in all joints; non-tender to palpation; no edema; normal capillary refill; no cyanosis, no bony tenderness or bony deformity of patient's extremities, no joint effusion, compartments are soft, extremities are warm and well-perfused, no ecchymosis or lacerations    SKIN: Normal color for age and race; warm NEURO: Moves all extremities equally, sensation to light touch intact diffusely, cranial nerves II through XII intact PSYCH: The patient's mood and manner are appropriate. Grooming and personal hygiene are appropriate.  MEDICAL DECISION MAKING:  Patient here with possible fall and possible unintentional overdose. Patient unable to tell me contact information for family members. No family at bedside. No obvious sign of trauma on her exam that will obtain a CT of her head and cervical spine given possible fall in an elderly patient. We'll also obtain a chest x-ray given cough with yellow sputum production for months. We'll obtain labs, urine for medical screening. EKG shows no ischemia, arrhythmia. She has no focal neurologic deficits on exam. Denies any acute complaints.  ED PROGRESS: Patient's workup has been unremarkable other than mildly elevated potassium and slight acute renal failure with creatinine of 1.37. Potassium is 5.6. No EKG changes. We'll give IV fluids. She does appear slightly dry on exam. CT of the head and cervical spine show no acute injury. Chest x-ray clear. Urine shows no sign of infection. We will attempt to get in touch with patient's family. We will ambulate patient. I feel that if she has someone who can pick her up, I feel she is safe to be discharged home. I suspect there  may have been polypharmacy involved. Again she denies any intentional overdose.  4:20 AM  Pt has received a liter of IV fluids. She has been able to ambulate without significant assistance. Uses a walker at home. Her son is now at bedside. He agrees that this was likely polypharmacy and states he thinks that she took extra Seroquel and benzodiazepines tonight. She has been hemodynamically stable here, drowsy but easily arousable and oriented without focal neurologic deficits or complaints of pain. No recent infectious symptoms. I feel she is safe to be discharged home with close follow-up. Family plans to "calm up with a better plan" to administer her medications. Again she denies that this was intentional. Her abdomen to close follow-up with her PCP. Discussed return precautions.   At this time, I do not feel there is any life-threatening condition  present. I have reviewed and discussed all results (EKG, imaging, lab, urine as appropriate) and exam findings with patient/family. I have reviewed nursing notes and appropriate previous records.  I feel the patient is safe to be discharged home without further emergent workup and can continue workup as an outpatient as needed. Discussed usual and customary return precautions. Patient/family verbalize understanding and are comfortable with this plan.  Outpatient follow-up has been provided. All questions have been answered.   EKG Interpretation  Date/Time:  Sunday August 22 2016 00:04:41 EST Ventricular Rate:  70 PR Interval:    QRS Duration: 95 QT Interval:  405 QTC Calculation: 437 R Axis:   162 Text Interpretation:  Sinus rhythm Supraventricular bigeminy Probable lateral infarct, age indeterminate No significant change since last tracing Confirmed by Maiah Sinning,  DO, Dougles Kimmey [phone]) on 08/22/2016 12:43:23 AM           Layla Maw Garhett Bernhard, DO 08/22/16 0425

## 2016-08-22 ENCOUNTER — Emergency Department (HOSPITAL_COMMUNITY): Payer: Medicare Other

## 2016-08-22 LAB — CBC WITH DIFFERENTIAL/PLATELET
Basophils Absolute: 0 10*3/uL (ref 0.0–0.1)
Basophils Relative: 0 %
EOS ABS: 0 10*3/uL (ref 0.0–0.7)
EOS PCT: 0 %
HCT: 38.4 % (ref 36.0–46.0)
Hemoglobin: 12.9 g/dL (ref 12.0–15.0)
LYMPHS ABS: 1.7 10*3/uL (ref 0.7–4.0)
Lymphocytes Relative: 18 %
MCH: 30 pg (ref 26.0–34.0)
MCHC: 33.6 g/dL (ref 30.0–36.0)
MCV: 89.3 fL (ref 78.0–100.0)
MONOS PCT: 6 %
Monocytes Absolute: 0.5 10*3/uL (ref 0.1–1.0)
Neutro Abs: 7.3 10*3/uL (ref 1.7–7.7)
Neutrophils Relative %: 76 %
PLATELETS: 181 10*3/uL (ref 150–400)
RBC: 4.3 MIL/uL (ref 3.87–5.11)
RDW: 14 % (ref 11.5–15.5)
WBC: 9.5 10*3/uL (ref 4.0–10.5)

## 2016-08-22 LAB — COMPREHENSIVE METABOLIC PANEL
ALT: 14 U/L (ref 14–54)
ANION GAP: 10 (ref 5–15)
AST: 17 U/L (ref 15–41)
Albumin: 3.5 g/dL (ref 3.5–5.0)
Alkaline Phosphatase: 68 U/L (ref 38–126)
BUN: 28 mg/dL — ABNORMAL HIGH (ref 6–20)
CHLORIDE: 103 mmol/L (ref 101–111)
CO2: 23 mmol/L (ref 22–32)
Calcium: 9.3 mg/dL (ref 8.9–10.3)
Creatinine, Ser: 1.37 mg/dL — ABNORMAL HIGH (ref 0.44–1.00)
GFR calc non Af Amer: 36 mL/min — ABNORMAL LOW (ref >60)
GFR, EST AFRICAN AMERICAN: 41 mL/min — AB (ref >60)
Glucose, Bld: 135 mg/dL — ABNORMAL HIGH (ref 65–99)
POTASSIUM: 5.6 mmol/L — AB (ref 3.5–5.1)
SODIUM: 136 mmol/L (ref 135–145)
Total Bilirubin: 0.5 mg/dL (ref 0.3–1.2)
Total Protein: 7.3 g/dL (ref 6.5–8.1)

## 2016-08-22 LAB — RAPID URINE DRUG SCREEN, HOSP PERFORMED
Amphetamines: NOT DETECTED
BARBITURATES: NOT DETECTED
BENZODIAZEPINES: POSITIVE — AB
COCAINE: NOT DETECTED
Opiates: NOT DETECTED
TETRAHYDROCANNABINOL: NOT DETECTED

## 2016-08-22 LAB — URINALYSIS, ROUTINE W REFLEX MICROSCOPIC
Bilirubin Urine: NEGATIVE
Glucose, UA: NEGATIVE mg/dL
Hgb urine dipstick: NEGATIVE
Ketones, ur: NEGATIVE mg/dL
LEUKOCYTES UA: NEGATIVE
NITRITE: NEGATIVE
PH: 5 (ref 5.0–8.0)
Protein, ur: NEGATIVE mg/dL
SPECIFIC GRAVITY, URINE: 1.005 (ref 1.005–1.030)

## 2016-08-22 LAB — SALICYLATE LEVEL: Salicylate Lvl: <7 mg/dL (ref 2.8–30.0)

## 2016-08-22 LAB — ACETAMINOPHEN LEVEL: Acetaminophen (Tylenol), Serum: <10 ug/mL — ABNORMAL LOW (ref 10–30)

## 2016-08-22 LAB — ETHANOL: Alcohol, Ethyl (B): <5 mg/dL (ref <5)

## 2016-08-22 MED ORDER — SODIUM CHLORIDE 0.9 % IV BOLUS (SEPSIS)
1000.0000 mL | Freq: Once | INTRAVENOUS | Status: AC
  Administered 2016-08-22: 1000 mL via INTRAVENOUS

## 2016-08-22 NOTE — ED Notes (Signed)
Got in contact with patients son Sherrill RaringRuss. Informed him that Dr. Javier DockerWanted a family member to be present at hospital. He stated he would call RN back

## 2016-08-22 NOTE — Discharge Instructions (Signed)
Please make sure you're only taking your medications as prescribed by your primary care physician. Your labs today showed mild elevation of your potassium at 5.6 without changes in your EKG and mild elevation of your kidney function which is also known as her creatinine at 1.37.  I recommend that you follow-up with your primary care doctor to have this rechecked in one week. We have given you IV fluids to help with this. Your workup otherwise has been unremarkable including normal labs, CT of your head and neck, normal chest x-ray and normal urine sample.

## 2016-08-22 NOTE — ED Notes (Signed)
Unable to get patients sig due to condition

## 2016-08-22 NOTE — ED Notes (Signed)
Spoke with son Sherrill RaringRuss, he stated he would be up to get her within the hour

## 2016-08-22 NOTE — ED Notes (Signed)
Pt was ambulated with assistance. Pt states she uses a walker at home

## 2016-08-22 NOTE — ED Notes (Signed)
Pt and family understood dc material. NAD noted 

## 2016-08-31 ENCOUNTER — Encounter (HOSPITAL_COMMUNITY): Payer: Self-pay | Admitting: *Deleted

## 2016-08-31 ENCOUNTER — Observation Stay (HOSPITAL_COMMUNITY)
Admission: EM | Admit: 2016-08-31 | Discharge: 2016-09-01 | Disposition: A | Discharge status: Home / Self Care | Payer: Medicare Other | Attending: Internal Medicine | Admitting: Internal Medicine

## 2016-08-31 ENCOUNTER — Emergency Department (HOSPITAL_COMMUNITY): Payer: Medicare Other

## 2016-08-31 DIAGNOSIS — I447 Left bundle-branch block, unspecified: Secondary | ICD-10-CM | POA: Insufficient documentation

## 2016-08-31 DIAGNOSIS — F419 Anxiety disorder, unspecified: Secondary | ICD-10-CM | POA: Insufficient documentation

## 2016-08-31 DIAGNOSIS — Z79899 Other long term (current) drug therapy: Secondary | ICD-10-CM | POA: Diagnosis not present

## 2016-08-31 DIAGNOSIS — I2510 Atherosclerotic heart disease of native coronary artery without angina pectoris: Secondary | ICD-10-CM | POA: Diagnosis present

## 2016-08-31 DIAGNOSIS — G20 Parkinson's disease: Secondary | ICD-10-CM | POA: Insufficient documentation

## 2016-08-31 DIAGNOSIS — Z794 Long term (current) use of insulin: Secondary | ICD-10-CM | POA: Diagnosis not present

## 2016-08-31 DIAGNOSIS — E11649 Type 2 diabetes mellitus with hypoglycemia without coma: Principal | ICD-10-CM | POA: Insufficient documentation

## 2016-08-31 DIAGNOSIS — Z791 Long term (current) use of non-steroidal anti-inflammatories (NSAID): Secondary | ICD-10-CM | POA: Insufficient documentation

## 2016-08-31 DIAGNOSIS — I10 Essential (primary) hypertension: Secondary | ICD-10-CM | POA: Insufficient documentation

## 2016-08-31 DIAGNOSIS — F0281 Dementia in other diseases classified elsewhere with behavioral disturbance: Secondary | ICD-10-CM | POA: Diagnosis present

## 2016-08-31 DIAGNOSIS — E669 Obesity, unspecified: Secondary | ICD-10-CM

## 2016-08-31 DIAGNOSIS — E1169 Type 2 diabetes mellitus with other specified complication: Secondary | ICD-10-CM | POA: Diagnosis present

## 2016-08-31 DIAGNOSIS — G3183 Dementia with Lewy bodies: Secondary | ICD-10-CM

## 2016-08-31 DIAGNOSIS — E162 Hypoglycemia, unspecified: Secondary | ICD-10-CM | POA: Diagnosis not present

## 2016-08-31 DIAGNOSIS — R413 Other amnesia: Secondary | ICD-10-CM | POA: Diagnosis not present

## 2016-08-31 LAB — BASIC METABOLIC PANEL
Anion gap: 7 (ref 5–15)
BUN: 20 mg/dL (ref 6–20)
CALCIUM: 8.8 mg/dL — AB (ref 8.9–10.3)
CO2: 25 mmol/L (ref 22–32)
CREATININE: 1.02 mg/dL — AB (ref 0.44–1.00)
Chloride: 105 mmol/L (ref 101–111)
GFR calc non Af Amer: 51 mL/min — ABNORMAL LOW (ref >60)
GFR, EST AFRICAN AMERICAN: 59 mL/min — AB (ref >60)
GLUCOSE: 75 mg/dL (ref 65–99)
Potassium: 4.7 mmol/L (ref 3.5–5.1)
Sodium: 137 mmol/L (ref 135–145)

## 2016-08-31 LAB — CBC WITH DIFFERENTIAL/PLATELET
Basophils Absolute: 0 10*3/uL (ref 0.0–0.1)
Basophils Relative: 0 %
EOS PCT: 0 %
Eosinophils Absolute: 0 10*3/uL (ref 0.0–0.7)
HEMATOCRIT: 38.4 % (ref 36.0–46.0)
Hemoglobin: 12.7 g/dL (ref 12.0–15.0)
Lymphocytes Relative: 16 %
Lymphs Abs: 1.7 10*3/uL (ref 0.7–4.0)
MCH: 29.5 pg (ref 26.0–34.0)
MCHC: 33.1 g/dL (ref 30.0–36.0)
MCV: 89.1 fL (ref 78.0–100.0)
MONO ABS: 0.6 10*3/uL (ref 0.1–1.0)
MONOS PCT: 6 %
NEUTROS ABS: 8.2 10*3/uL — AB (ref 1.7–7.7)
Neutrophils Relative %: 78 %
Platelets: 178 10*3/uL (ref 150–400)
RBC: 4.31 MIL/uL (ref 3.87–5.11)
RDW: 14 % (ref 11.5–15.5)
WBC: 10.5 10*3/uL (ref 4.0–10.5)

## 2016-08-31 LAB — CBG MONITORING, ED
Glucose-Capillary: 100 mg/dL — ABNORMAL HIGH (ref 65–99)
Glucose-Capillary: 76 mg/dL (ref 65–99)
Glucose-Capillary: 48 mg/dL — ABNORMAL LOW (ref 65–99)
Glucose-Capillary: 70 mg/dL (ref 65–99)

## 2016-08-31 MED ORDER — DEXTROSE 5 % IV SOLN
Freq: Once | INTRAVENOUS | Status: AC
  Administered 2016-08-31: 22:00:00 via INTRAVENOUS

## 2016-08-31 MED ORDER — SODIUM CHLORIDE 0.9% FLUSH: 3.0000 mL | Freq: Two times a day (BID) | INTRAVENOUS | Status: DC

## 2016-08-31 MED ORDER — ENOXAPARIN SODIUM 40 MG/0.4ML ~~LOC~~ SOLN
40.0000 mg | Freq: Every day | SUBCUTANEOUS | Status: DC
  Filled 2016-08-31: qty 0.4

## 2016-08-31 MED ORDER — DEXTROSE 5 % IV SOLN
Freq: Once | INTRAVENOUS | Status: AC
  Administered 2016-08-31: via INTRAVENOUS

## 2016-08-31 NOTE — ED Notes (Signed)
Pt given Malaw[place]urkey sandwich and ginger ale. CBG 70

## 2016-08-31 NOTE — H&P (Signed)
History and Physical    Lauren Thomas:[id number] DOB: 18-Dec-1934 DOA: 08/31/2016  PCP: Eartha Inch, MD   Patient coming from: Home.  Chief Complaint: Hypoglycemia.  HPI: Lauren Thomas is a 82 y.o. female with medical history significant of anxiety, osteoarthritis, irregular heart rhythm, macular degeneration, memory loss, diabetes on insulin, parkinsonism who is coming to the emergency department with complaints of 2 episodes of hypoglycemia at home. Per EMS first reading was 21 and second reading read lobe. EMS gave 25 g of dextrose and her CBG was 113. She states that she had a good lunch earlier today. She denies chest pain, palpitations, dizziness, lower extremity edema, but felt diaphoretic. She denies abdominal pain, nausea, emesis, diarrhea, dysuria or frequency.  ED Course: Her UA was normal. WBC 10.5, hemoglobin 12.7 g/dL and platelets 811. BMP showed a creatinine of 1.02 and a calcium level of 8 mg/dL. All the other values were within normal limits.  Review of Systems: As per HPI otherwise 10 point review of systems negative.    Past Medical History:  Diagnosis Date  . Anxiety   . Arthritis   . Diabetes mellitus without complication (HCC)   . Irregular heart rhythm   . Macular degeneration   . Memory loss     Past Surgical History:  Procedure Laterality Date  . ABDOMINAL HYSTERECTOMY    . BACK SURGERY    . CATARACT EXTRACTION    . CHOLECYSTECTOMY    . LEFT HEART CATHETERIZATION WITH CORONARY ANGIOGRAM N/A 10/15/2013   Procedure: LEFT HEART CATHETERIZATION WITH CORONARY ANGIOGRAM;  Surgeon: Lesleigh Noe, MD;  Location: [facility] CATH LAB;  Service: Cardiovascular;  Laterality: N/A;     reports that she has never smoked. She has never used smokeless tobacco. She reports that she does not drink alcohol or use drugs.  Allergies  Allergen Reactions  . Erythromycin     Heart flutter  . Penicillins     Patient has no idea what her reaction is   . Red Dye   .  Sulfonamide Derivatives     diarrhea  . Latex Rash  . Neosporin [Neomycin-Bacitracin Zn-Polymyx] Rash  . Polysporin [Bacitracin-Polymyxin B] Rash    Family History  Problem Relation Age of Onset  . Heart attack Mother   . Heart disease Mother   . Diabetes Mother   . Prostate cancer Father   . Diabetes Maternal Grandmother   . Heart disease Brother   . Lung cancer Brother     Prior to Admission medications   Medication Sig Start Date End Date Taking? Authorizing Provider  carbidopa-levodopa (SINEMET) 25-100 MG tablet Take 2 tablets by mouth 3 (three) times daily. 04/27/16   Levert Feinstein, MD  diclofenac (VOLTAREN) 75 MG EC tablet Take 75 mg by mouth 2 (two) times daily.  04/10/16   Historical Provider, MD  donepezil (ARICEPT) 10 MG tablet Take 1 tablet (10 mg total) by mouth at bedtime. 04/27/16   Levert Feinstein, MD  guaiFENesin (MUCINEX) 600 MG 12 hr tablet Take 600 mg by mouth 2 (two) times daily as needed for cough or to loosen phlegm.    Historical Provider, MD  HUMULIN 70/30 (70-30) 100 UNIT/ML injection Inject 30-50 Units into the skin See admin instructions. Use 50 units every morning and use 30 units every evening 07/19/16   Historical Provider, MD  hydroxypropyl methylcellulose (ISOPTO TEARS) 2.5 % ophthalmic solution Place 1 drop into both eyes 3 (three) times daily as needed for dry eyes.  Historical Provider, MD  hyoscyamine (ANASPAZ) 0.125 MG TBDP disintergrating tablet Place 0.125 mg under the tongue every 6 (six) hours as needed for bladder spasms or cramping.    Historical Provider, MD  LORazepam (ATIVAN) 1 MG tablet Take 1 mg by mouth 2 (two) times daily. 08/17/16   Historical Provider, MD  losartan (COZAAR) 25 MG tablet Take 25 mg by mouth daily.  04/23/16   Historical Provider, MD  NUPLAZID 17 MG TABS Take 17 mg by mouth 2 (two) times daily. 08/20/16   Historical Provider, MD  QUEtiapine (SEROQUEL) 25 MG tablet TAKE 1 TABLET(25 MG) BY MOUTH TWICE DAILY 08/17/16   Asa Lenteichard A Sater, MD      Physical Exam:  Constitutional: NAD, calm, comfortable Vitals:   08/31/16 1935 08/31/16 2100 08/31/16 2115 08/31/16 2130  BP: (!) 134/40 (!) 143/51 135/79 139/57  Pulse: 62 (!) 53 (!) 55 60  Resp: 16 17 13 18   Temp: 97.6 F (36.4 C)     SpO2: 99% 98% 98% 97%   Eyes: PERRL, lids and conjunctivae normal ENMT: Mucous membranes are moist. Posterior pharynx clear of any exudate or lesions. Neck: normal, supple, no masses, no thyromegaly Respiratory: clear to auscultation bilaterally, no wheezing, no crackles. Normal respiratory effort. No accessory muscle use.  Cardiovascular: Regular rate and rhythm, no murmurs / rubs / gallops. No extremity edema. 2+ pedal pulses. No carotid bruits.  Abdomen: Soft, no tenderness, no masses palpated. No hepatosplenomegaly. Bowel sounds positive.  Musculoskeletal: no clubbing / cyanosis.Good ROM, no contractures. Normal muscle tone.  Skin: No significant rashes, lesions, ulcers on limited skin exam. Neurologic: CN 2-12 grossly intact. Sensation intact, DTR normal. Strength 5/5 in all 4.  Psychiatric: Normal judgment and insight. Alert and oriented x 3. Normal mood.    Labs on Admission: I have personally reviewed following labs and imaging studies  CBC:  Recent Labs Lab 08/31/16 2058  WBC 10.5  NEUTROABS 8.2*  HGB 12.7  HCT 38.4  MCV 89.1  PLT 178   Basic Metabolic Panel:  Recent Labs Lab 08/31/16 2058  NA 137  K 4.7  CL 105  CO2 25  GLUCOSE 75  BUN 20  CREATININE 1.02*  CALCIUM 8.8*   GFR: CrCl cannot be calculated (Unknown ideal weight.). Liver Function Tests: No results for input(s): AST, ALT, ALKPHOS, BILITOT, PROT, ALBUMIN in the last 168 hours. No results for input(s): LIPASE, AMYLASE in the last 168 hours. No results for input(s): AMMONIA in the last 168 hours. Coagulation Profile: No results for input(s): INR, PROTIME in the last 168 hours. Cardiac Enzymes: No results for input(s): CKTOTAL, CKMB, CKMBINDEX,  TROPONINI in the last 168 hours. BNP (last 3 results) No results for input(s): PROBNP in the last 8760 hours. HbA1C: No results for input(s): HGBA1C in the last 72 hours. CBG:  Recent Labs Lab 08/31/16 1932 08/31/16 2015 08/31/16 2128 08/31/16 2254  GLUCAP 100* 76 48* 70   Lipid Profile: No results for input(s): CHOL, HDL, LDLCALC, TRIG, CHOLHDL, LDLDIRECT in the last 72 hours. Thyroid Function Tests: No results for input(s): TSH, T4TOTAL, FREET4, T3FREE, THYROIDAB in the last 72 hours. Anemia Panel: No results for input(s): VITAMINB12, FOLATE, FERRITIN, TIBC, IRON, RETICCTPCT in the last 72 hours. Urine analysis:    Component Value Date/Time   COLORURINE STRAW (A) 08/22/2016 0100   APPEARANCEUR CLEAR 08/22/2016 0100   LABSPEC 1.005 08/22/2016 0100   PHURINE 5.0 08/22/2016 0100   GLUCOSEU NEGATIVE 08/22/2016 0100   HGBUR NEGATIVE 08/22/2016 0100  BILIRUBINUR NEGATIVE 08/22/2016 0100   KETONESUR NEGATIVE 08/22/2016 0100   PROTEINUR NEGATIVE 08/22/2016 0100   UROBILINOGEN 0.2 01/23/2015 2035   NITRITE NEGATIVE 08/22/2016 0100   LEUKOCYTESUR NEGATIVE 08/22/2016 0100    Radiological Exams on Admission: Dg Chest 2 View  Result Date: 08/31/2016 CLINICAL DATA:  Hypoglycemia at home.  History of diabetes. EXAM: CHEST  2 VIEW COMPARISON:  Chest radiograph August 22, 2016 FINDINGS: The cardiac silhouette is upper limits of normal in size, unchanged. Mediastinal silhouette is nonsuspicious. Bibasilar strandy densities without pleural effusion or focal consolidation. No pneumothorax. High-riding RIGHT humeral head compatible with old rotator cuff injury. Moderate degenerative changes shoulders. Moderate degenerative change of the thoracic spine. IMPRESSION: Borderline cardiomegaly.  Bibasilar atelectasis. Electronically Signed   By: Awilda Metro M.D.   On: 08/31/2016 20:53   Dg Shoulder Left  Result Date: 08/31/2016 CLINICAL DATA:  LEFT shoulder pain.  Hypoglycemia. EXAM:  LEFT SHOULDER - 2+ VIEW COMPARISON:  None. FINDINGS: The humeral head is well-formed and located. The subacromial, glenohumeral and acromioclavicular joint spaces are intact. Moderate subacromial joint space narrowing and minimal undersurface spurring. Mild enthesopathy greater tuberosity. Moderate subacromial joint space narrowing, which can be seen with old rotator cuff injury. No destructive bony lesions. Soft tissue planes are non-suspicious. IMPRESSION: No acute osseous process. Mild to moderate degenerative change of the shoulder. Electronically Signed   By: Awilda Metro M.D.   On: 08/31/2016 20:54    EKG: Independently reviewed. Vent. rate 59 BPM PR interval * ms QRS duration 128 ms QT/QTc 443/439 ms P-R-T axes 74 50 61 Sinus rhythm Short PR interval Left bundle branch block Baseline wander in lead(s) V6  Assessment/Plan Principal Problem:   Hypoglycemia Admit to stepdown/observation. Continue dextrose infusion. Monitor CBG closely. May be able to go home in a.m. With PCP follow up.  Active Problems:   HYPERTENSION, BENIGN Continue losartan 25 mg by mouth daily. Monitor blood pressure.    CAD, NATIVE VESSEL  mild CAD at cath 2002 and coronary CT 07/11 Denies any symptoms at this time. Continue low-dose aspirin.    Diabetes mellitus type 2 in obese (HCC) Carbohydrate modified diet. Hold insulin due to hypoglycemia.    Parkinsonism (HCC) Continue Sinemet.    Memory loss Continue Aricept.    Anxiety Continue lorazepam 1 mg by mouth twice a day as needed.   DVT prophylaxis: Lovenox SQ. Code Status: Full code. Family Communication:  Disposition Plan: Admit overnight for glucose infusion and CBG monitoring. Consults called:  Admission status: Observation/stepdown.   Bobette Mo MD [facility] Pager [phone].  If 7PM-7AM, please contact night-coverage [email or web address] Password [facility]  08/31/2016, 11:34 PM

## 2016-08-31 NOTE — ED Provider Notes (Signed)
[facility]-EMERGENCY DEPT Provider Note   CSN: [id number] Arrival date & time: 08/31/16  1931     History   Chief Complaint Chief Complaint  Patient presents with  . Hypoglycemia     HPI  Blood pressure (!) 134/40, pulse 62, temperature 97.6 F (36.4 C), resp. rate 16, SpO2 99 %.  Lauren Thomas is a 82 y.o. female past medical history significant for insulin dependent diabetes (No sulfonylureas) memory loss and parkinsons BIBEMS for hypoglycemia patient's last meal was at noon, CBG was read as 21 and a second reading was read as low. EMS gave 25 g of dextrose was CBG responding normally at 113. Patient states that she was given a prescription for UTI 1 week ago but states she hasn't had it filled due to issues with her insurance. She cannot tell me how much insulin she took today. She denies dysuria, hematuria, urinary frequency, fever, chills, nausea, vomiting, chest pain or abdominal pain. She reports a left's pain and she denies any trauma or falls. Denies cough, rhinorrhea, fever, chills.  Past Medical History:  Diagnosis Date  . Anxiety   . Arthritis   . Diabetes mellitus without complication (HCC)   . Irregular heart rhythm   . Macular degeneration   . Memory loss     Patient Active Problem List   Diagnosis Date Noted  . Visual hallucination 07/27/2016  . Anxiety 05/14/2016  . Hallucinations 05/14/2016  . Memory loss 04/27/2016  . Parkinsonism (HCC) 08/11/2015  . Abnormality of gait 08/11/2015  . Elevated troponin 10/12/2013  . Diabetes mellitus type 2 in obese (HCC) 10/12/2013  . Palpitations 10/12/2013  . Abnormal finding on EKG 10/12/2013  . LUNG NODULE- evaluated 2012 07/17/2010  . HYPERTENSION, BENIGN 01/06/2010  . OBESITY-MORBID 12/31/2009  . CAD, NATIVE VESSEL- mild CAD at cath 2002 and coronary CT 07/11 12/31/2009  . CHEST PAIN-UNSPECIFIED 12/31/2009    Past Surgical History:  Procedure Laterality Date  . ABDOMINAL HYSTERECTOMY    . BACK SURGERY    .  CATARACT EXTRACTION    . CHOLECYSTECTOMY    . LEFT HEART CATHETERIZATION WITH CORONARY ANGIOGRAM N/A 10/15/2013   Procedure: LEFT HEART CATHETERIZATION WITH CORONARY ANGIOGRAM;  Surgeon: Lesleigh Noe, MD;  Location: [facility] CATH LAB;  Service: Cardiovascular;  Laterality: N/A;    OB History    No data available       Home Medications    Prior to Admission medications   Medication Sig Start Date End Date Taking? Authorizing Provider  carbidopa-levodopa (SINEMET) 25-100 MG tablet Take 2 tablets by mouth 3 (three) times daily. 04/27/16   Levert Feinstein, MD  diclofenac (VOLTAREN) 75 MG EC tablet Take 75 mg by mouth 2 (two) times daily.  04/10/16   Historical Provider, MD  donepezil (ARICEPT) 10 MG tablet Take 1 tablet (10 mg total) by mouth at bedtime. 04/27/16   Levert Feinstein, MD  guaiFENesin (MUCINEX) 600 MG 12 hr tablet Take 600 mg by mouth 2 (two) times daily as needed for cough or to loosen phlegm.    Historical Provider, MD  HUMULIN 70/30 (70-30) 100 UNIT/ML injection Inject 30-50 Units into the skin See admin instructions. Use 50 units every morning and use 30 units every evening 07/19/16   Historical Provider, MD  hydroxypropyl methylcellulose (ISOPTO TEARS) 2.5 % ophthalmic solution Place 1 drop into both eyes 3 (three) times daily as needed for dry eyes.     Historical Provider, MD  hyoscyamine (ANASPAZ) 0.125 MG TBDP disintergrating tablet  Place 0.125 mg under the tongue every 6 (six) hours as needed for bladder spasms or cramping.    Historical Provider, MD  LORazepam (ATIVAN) 1 MG tablet Take 1 mg by mouth 2 (two) times daily. 08/17/16   Historical Provider, MD  losartan (COZAAR) 25 MG tablet Take 25 mg by mouth daily.  04/23/16   Historical Provider, MD  NUPLAZID 17 MG TABS Take 17 mg by mouth 2 (two) times daily. 08/20/16   Historical Provider, MD  QUEtiapine (SEROQUEL) 25 MG tablet TAKE 1 TABLET(25 MG) BY MOUTH TWICE DAILY 08/17/16   Asa Lenteichard A Sater, MD    Family History Family History    Problem Relation Age of Onset  . Heart attack Mother   . Heart disease Mother   . Diabetes Mother   . Prostate cancer Father   . Diabetes Maternal Grandmother   . Heart disease Brother   . Lung cancer Brother     Social History Social History  Substance Use Topics  . Smoking status: Never Smoker  . Smokeless tobacco: Never Used  . Alcohol use No     Allergies   Erythromycin; Penicillins; Red dye; Sulfonamide derivatives; Latex; Neosporin [neomycin-bacitracin zn-polymyx]; and Polysporin [bacitracin-polymyxin b]   Review of Systems Review of Systems  10 systems reviewed and found to be negative, except as noted in the HPI.   Physical Exam Updated Vital Signs BP 139/57   Pulse 60   Temp 97.6 F (36.4 C)   Resp 18   SpO2 97%   Physical Exam  Constitutional: She is oriented to person, place, and time. She appears well-developed and well-nourished. No distress.  HENT:  Head: Normocephalic and atraumatic.  Mouth/Throat: Oropharynx is clear and moist.  Eyes: Conjunctivae and EOM are normal. Pupils are equal, round, and reactive to light.  Neck: Normal range of motion.  Cardiovascular: Normal rate, regular rhythm and intact distal pulses.   Pulmonary/Chest: Effort normal and breath sounds normal.  Abdominal: Soft. There is no tenderness.  Musculoskeletal: Normal range of motion.  Neurological: She is alert and oriented to person, place, and time.  Skin: She is not diaphoretic.  Psychiatric: She has a normal mood and affect.  Nursing note and vitals reviewed.    ED Treatments / Results  Labs (all labs ordered are listed, but only abnormal results are displayed) Labs Reviewed  CBC WITH DIFFERENTIAL/PLATELET - Abnormal; Notable for the following:       Result Value   Neutro Abs 8.2 (*)    All other components within normal limits  BASIC METABOLIC PANEL - Abnormal; Notable for the following:    Creatinine, Ser 1.02 (*)    Calcium 8.8 (*)    GFR calc non Af Amer  51 (*)    GFR calc Af Amer 59 (*)    All other components within normal limits  CBG MONITORING, ED - Abnormal; Notable for the following:    Glucose-Capillary 100 (*)    All other components within normal limits  CBG MONITORING, ED - Abnormal; Notable for the following:    Glucose-Capillary 48 (*)    All other components within normal limits  URINALYSIS, ROUTINE W REFLEX MICROSCOPIC  CBG MONITORING, ED  CBG MONITORING, ED    EKG  EKG Interpretation  Date/Time:  Tuesday August 31 2016 21:00:26 EST Ventricular Rate:  59 PR Interval:    QRS Duration: 128 QT Interval:  443 QTC Calculation: 439 R Axis:   50 Text Interpretation:  Sinus rhythm Short PR interval  Left bundle branch block Baseline wander in lead(s) V6 Confirmed by Ranae Palms  MD, DAVID ([id number]) on 08/31/2016 9:50:11 PM       Radiology Dg Chest 2 View  Result Date: 08/31/2016 CLINICAL DATA:  Hypoglycemia at home.  History of diabetes. EXAM: CHEST  2 VIEW COMPARISON:  Chest radiograph August 22, 2016 FINDINGS: The cardiac silhouette is upper limits of normal in size, unchanged. Mediastinal silhouette is nonsuspicious. Bibasilar strandy densities without pleural effusion or focal consolidation. No pneumothorax. High-riding RIGHT humeral head compatible with old rotator cuff injury. Moderate degenerative changes shoulders. Moderate degenerative change of the thoracic spine. IMPRESSION: Borderline cardiomegaly.  Bibasilar atelectasis. Electronically Signed   By: Awilda Metro M.D.   On: 08/31/2016 20:53   Dg Shoulder Left  Result Date: 08/31/2016 CLINICAL DATA:  LEFT shoulder pain.  Hypoglycemia. EXAM: LEFT SHOULDER - 2+ VIEW COMPARISON:  None. FINDINGS: The humeral head is well-formed and located. The subacromial, glenohumeral and acromioclavicular joint spaces are intact. Moderate subacromial joint space narrowing and minimal undersurface spurring. Mild enthesopathy greater tuberosity. Moderate subacromial joint space  narrowing, which can be seen with old rotator cuff injury. No destructive bony lesions. Soft tissue planes are non-suspicious. IMPRESSION: No acute osseous process. Mild to moderate degenerative change of the shoulder. Electronically Signed   By: Awilda Metro M.D.   On: 08/31/2016 20:54    Procedures Procedures (including critical care time)  CRITICAL CARE Performed by: Wynetta Emery   Total critical care time: 35 minutes  Critical care time was exclusive of separately billable procedures and treating other patients.  Critical care was necessary to treat or prevent imminent or life-threatening deterioration.  Critical care was time spent personally by me on the following activities: development of treatment plan with patient and/or surrogate as well as nursing, discussions with consultants, evaluation of patient's response to treatment, examination of patient, obtaining history from patient or surrogate, ordering and performing treatments and interventions, ordering and review of laboratory studies, ordering and review of radiographic studies, pulse oximetry and re-evaluation of patient's condition.   Medications Ordered in ED Medications  dextrose 5 % solution (not administered)  dextrose 5 % solution ( Intravenous New Bag/Given 08/31/16 2136)     Initial Impression / Assessment and Plan / ED Course  I have reviewed the triage vital signs and the nursing notes.  Pertinent labs & imaging results that were available during my care of the patient were reviewed by me and considered in my medical decision making (see chart for details).     Vitals:   08/31/16 1935 08/31/16 2100 08/31/16 2115 08/31/16 2130  BP: (!) 134/40 (!) 143/51 135/79 139/57  Pulse: 62 (!) 53 (!) 55 60  Resp: 16 17 13 18   Temp: 97.6 F (36.4 C)     SpO2: 99% 98% 98% 97%    Medications  dextrose 5 % solution (not administered)  dextrose 5 % solution ( Intravenous New Bag/Given 08/31/16 2136)     Lauren Thomas is 82 y.o. female presenting with Hypoglycemia via EMS. Blood sugar was 21 and then just read as low, it responded well to D 25 given by EMS. Patient is alert and oriented but possibly somewhat confused on my exam. Per EMS she is being treated for UTI with Cipro however she tells me that she could not get this prescription filled, neurologic exam is nonfocal. Chart review shows that this patient was seen several weeks ago and had an accidental overdose thought to be secondary to polypharmacy:  She takes Xanax and Seroquel. No sulfonylureas that showed a long hypoglycemia. She states that she does not have any recent medication changes. She reports a pain in the left arm, does not known trauma.  EKG with no acute findings, chest x-ray and shoulder x-ray negative, blood work reassuring, normal creatinine.  There is been delay in obtaining urine with multiple sick patients in the department. Her blood sugar has dropped below 50 again, will put her on a D5 drip (backorder on D10) will need admission for persistent hypoglycemia, recommend case management consultation in the a.m. to ensure that this patient is administering her insulin appropriately, she may need home health.  Case discussed with [facility] hospital[facility] Dr. Robb Matar who accepts admission.  This is a shared visit with the attending physician who personally evaluated the patient and agrees with the care plan.    Final Clinical Impressions(s) / ED Diagnoses   Final diagnoses:  Hypoglycemia    New Prescriptions New Prescriptions   No medications on file     Wynetta Emery, PA-C 08/31/16 2332    Loren Racer, MD 08/31/16 2354

## 2016-08-31 NOTE — ED Triage Notes (Signed)
Pt to ED by [facility] c/o two episodes of hypoglycemia at home. Last meal was around noon. First CBG read 21, second reading read low. Pt is A&Ox3 on arrival. EMS gave 25g dextrose with last cbg 113. Pt currently being treated for UTI with cipro

## 2016-09-01 ENCOUNTER — Encounter: Payer: Self-pay | Admitting: *Deleted

## 2016-09-01 ENCOUNTER — Telehealth: Payer: Self-pay | Admitting: Neurology

## 2016-09-01 ENCOUNTER — Encounter (HOSPITAL_COMMUNITY): Payer: Self-pay | Admitting: Internal Medicine

## 2016-09-01 DIAGNOSIS — F329 Major depressive disorder, single episode, unspecified: Secondary | ICD-10-CM | POA: Insufficient documentation

## 2016-09-01 DIAGNOSIS — E162 Hypoglycemia, unspecified: Secondary | ICD-10-CM | POA: Diagnosis not present

## 2016-09-01 DIAGNOSIS — F32A Depression, unspecified: Secondary | ICD-10-CM | POA: Insufficient documentation

## 2016-09-01 LAB — CBG MONITORING, ED
GLUCOSE-CAPILLARY: 161 mg/dL — AB (ref 65–99)
GLUCOSE-CAPILLARY: 167 mg/dL — AB (ref 65–99)
Glucose-Capillary: 168 mg/dL — ABNORMAL HIGH (ref 65–99)
Glucose-Capillary: 147 mg/dL — ABNORMAL HIGH (ref 65–99)
Glucose-Capillary: 134 mg/dL — ABNORMAL HIGH (ref 65–99)
Glucose-Capillary: 163 mg/dL — ABNORMAL HIGH (ref 65–99)

## 2016-09-01 LAB — URINALYSIS, ROUTINE W REFLEX MICROSCOPIC
BILIRUBIN URINE: NEGATIVE
Glucose, UA: NEGATIVE mg/dL
Hgb urine dipstick: NEGATIVE
KETONES UR: NEGATIVE mg/dL
LEUKOCYTES UA: NEGATIVE
NITRITE: NEGATIVE
PROTEIN: NEGATIVE mg/dL
Specific Gravity, Urine: 1.014 (ref 1.005–1.030)
pH: 5 (ref 5.0–8.0)

## 2016-09-01 MED ORDER — CARBIDOPA-LEVODOPA 25-100 MG PO TABS
2.0000 | ORAL_TABLET | Freq: Three times a day (TID) | ORAL | Status: DC
  Filled 2016-09-01 (×2): qty 2

## 2016-09-01 MED ORDER — HYOSCYAMINE SULFATE 0.125 MG PO TBDP
0.1250 mg | ORAL_TABLET | Freq: Four times a day (QID) | ORAL | Status: DC | PRN
  Filled 2016-09-01: qty 1

## 2016-09-01 MED ORDER — HUMULIN 70/30 (70-30) 100 UNIT/ML ~~LOC~~ SUSP: 20.0000 [IU] | SUBCUTANEOUS | 2 refills | Status: DC

## 2016-09-01 MED ORDER — PIMAVANSERIN TARTRATE 17 MG PO TABS: 17.0000 mg | ORAL_TABLET | Freq: Two times a day (BID) | ORAL | Status: DC

## 2016-09-01 MED ORDER — LORAZEPAM 1 MG PO TABS
1.0000 mg | ORAL_TABLET | Freq: Two times a day (BID) | ORAL | Status: DC
  Filled 2016-09-01: qty 1

## 2016-09-01 MED ORDER — GUAIFENESIN ER 600 MG PO TB12: 600.0000 mg | ORAL_TABLET | Freq: Two times a day (BID) | ORAL | Status: DC | PRN

## 2016-09-01 MED ORDER — DONEPEZIL HCL 10 MG PO TABS: 10.0000 mg | ORAL_TABLET | Freq: Every day | ORAL | Status: DC

## 2016-09-01 MED ORDER — QUETIAPINE FUMARATE 25 MG PO TABS
25.0000 mg | ORAL_TABLET | Freq: Two times a day (BID) | ORAL | Status: DC
  Filled 2016-09-01 (×2): qty 1

## 2016-09-01 MED ORDER — DICLOFENAC SODIUM 75 MG PO TBEC
75.0000 mg | DELAYED_RELEASE_TABLET | Freq: Two times a day (BID) | ORAL | Status: DC
  Filled 2016-09-01: qty 1

## 2016-09-01 MED ORDER — HYPROMELLOSE (GONIOSCOPIC) 2.5 % OP SOLN: 1.0000 [drp] | Freq: Three times a day (TID) | OPHTHALMIC | Status: DC | PRN

## 2016-09-01 MED ORDER — LOSARTAN POTASSIUM 25 MG PO TABS
25.0000 mg | ORAL_TABLET | Freq: Every day | ORAL | Status: DC
  Filled 2016-09-01: qty 1

## 2016-09-01 NOTE — ED Notes (Signed)
Pt able to stand and turn to wheelchair. Pt family taking pt home and will follow up with primary care MD

## 2016-09-01 NOTE — ED Notes (Signed)
Pt had a large bowel movement in the bedpan.

## 2016-09-01 NOTE — ED Notes (Signed)
Lunch tray arrived for pt.

## 2016-09-01 NOTE — Telephone Encounter (Signed)
Patient's husband is calling to discuss patient. Her behavior has worsened and she is having hallucinations.

## 2016-09-01 NOTE — Telephone Encounter (Addendum)
Spoke to MattelEddie Klumpp - patient is having more hallucinations (talking to people who are not there, going through the motion of eating food without anything in her hand).  She recently saw Dr. Donell BeersPlovsky for depression - he discontinued her Seroquel and Lorazepam.  He started her on Zoloft 50mg , daily and Abilify 2mg , daily.  She has only been taking these medications for two weeks.  He feels hallucinations have been worse since these medication changes.

## 2016-09-01 NOTE — Care Management Note (Signed)
Case Management Note  Patient Details  Name: Lauren Thomas MRN: [id number] Date of Birth: [birth year]  Subjective/Objective:                  From home with house mate ([name]);ex-husband [name].  /81 y.o. female with medical history significant of anxiety, osteoarthritis, irregular heart rhythm, macular degeneration, memory loss, diabetes on insulin, parkinsonism who is coming to the emergency department with complaints of 2 episodes of hypoglycemia at home.   Action/Plan: Admit status OBSERVATION (HYPOGYLCEMIA); anticipate discharge HOME WITH HOME HEALTH.   Expected Discharge Date:   (unsure)               Expected Discharge Plan:  Home w Home Health Services  In-House Referral:  NA  Discharge planning Services  CM Consult  Post Acute Care Choice:    Choice offered to:     DME Arranged:    DME Agency:     HH Arranged:    HH Agency:     Status of Service:  In process, will continue to follow  If discussed at Long Length of Stay Meetings, dates discussed:    Additional Comments: She lives with her house mate-- [name]. He also has medical issues. She does require assistance with ADLs. This is not due to her memory but rather physical limitations. Her ex-husband [name] handles her finances. She no longer cooks meals. She states due to her macular degeneration she is unable to cook. Her and [name] primarily have take out.  Oletta CohnWood, Domnique Vantine, RN 09/01/2016, 9:15 AM

## 2016-09-01 NOTE — Discharge Summary (Signed)
Physician Discharge Summary  Lauren Thomas ZOX:[id number] DOB: 01-Oct-1934 DOA: 08/31/2016  PCP: Eartha Inch, MD  Admit date: 08/31/2016 Discharge date: 09/01/2016  Admitted From: home Disposition:  home  Recommendations for Outpatient Follow-up:  1. Follow up with PCP in 1-2 weeks  Home Health: none Equipment/Devices: none  Discharge Condition: stable CODE STATUS: Full code Diet recommendation: diabetic  HPI: per Dr. Robb Matar, Lauren Thomas is a 82 y.o. female with medical history significant of anxiety, osteoarthritis, irregular heart rhythm, macular degeneration, memory loss, diabetes on insulin, parkinsonism who is coming to the emergency department with complaints of 2 episodes of hypoglycemia at home. Per EMS first reading was 21 and second reading read lobe. EMS gave 25 g of dextrose and her CBG was 113. She states that she had a good lunch earlier today. She denies chest pain, palpitations, dizziness, lower extremity edema, but felt diaphoretic. She denies abdominal pain, nausea, emesis, diarrhea, dysuria or frequency.  Hospital Course: Discharge Diagnoses:  Principal Problem:   Hypoglycemia Active Problems:   HYPERTENSION, BENIGN   CAD, NATIVE VESSEL- mild CAD at cath 2002 and coronary CT 07/11   Diabetes mellitus type 2 in obese (HCC)   Parkinsonism (HCC)   Memory loss   Anxiety   Hypoglycemia -patient was admitted to the hospital with hypoglycemia, likely in the setting of too much insulin administration, patient is administering her own insulin and she has underlying dementia.  It is not clear whether she took too much.  She was placed on IV dextrose infusion, her CBGs have improved, her dextrose infusion was discontinued and she was lost to eat, she was able to tolerate a regular diet without nausea vomiting and without further hypoglycemic episodes.  Discussed extensively at bedside with the patient's son, he would be more involved in the patient's insulin  administration to avoid further hypoglycemic episodes.  To be on the safe side, I will decrease her insulin dose on discharge, and advised close outpatient follow-up in 1-2 weeks and regular CBG checks at home. HYPERTENSION, BENIGN -stable, continue home medications CAD, NATIVE VESSEL - mild CAD at cath 2002 and coronary CT 07/11, asymptomatic Diabetes mellitus type 2 in obese (HCC) - insulin changed as above Parkinsonism (HCC) - Continue Sinemet. Memory loss - Continue Aricept. Anxiety - Continue lorazepam 1 mg by mouth twice a day as needed.   Discharge Instructions   Allergies as of 09/01/2016      Reactions   Erythromycin    Heart flutter   Penicillins    Patient has no idea what her reaction is   Red Dye    Sulfonamide Derivatives    diarrhea   Latex Rash   Neosporin [neomycin-bacitracin Zn-polymyx] Rash   Polysporin [bacitracin-polymyxin B] Rash      Medication List    TAKE these medications   carbidopa-levodopa 25-100 MG tablet Commonly known as:  SINEMET Take 2 tablets by mouth 3 (three) times daily.   diclofenac 75 MG EC tablet Commonly known as:  VOLTAREN Take 75 mg by mouth 2 (two) times daily.   donepezil 10 MG tablet Commonly known as:  ARICEPT Take 1 tablet (10 mg total) by mouth at bedtime.   guaiFENesin 600 MG 12 hr tablet Commonly known as:  MUCINEX Take 600 mg by mouth 2 (two) times daily as needed for cough or to loosen phlegm.   HUMULIN 70/30 (70-30) 100 UNIT/ML injection Generic drug:  insulin NPH-regular Human Inject 20-35 Units into the skin See admin instructions. Use  35 units every morning and use 20 units every evening What changed:  how much to take  additional instructions   hydroxypropyl methylcellulose / hypromellose 2.5 % ophthalmic solution Commonly known as:  ISOPTO TEARS / GONIOVISC Place 1 drop into both eyes 3 (three) times daily as needed for dry eyes.   hyoscyamine 0.125 MG Tbdp disintergrating tablet Commonly known as:   ANASPAZ Place 0.125 mg under the tongue every 6 (six) hours as needed for bladder spasms or cramping.   LORazepam 1 MG tablet Commonly known as:  ATIVAN Take 1 mg by mouth 2 (two) times daily.   losartan 25 MG tablet Commonly known as:  COZAAR Take 25 mg by mouth daily.   NUPLAZID 17 MG Tabs Generic drug:  Pimavanserin Tartrate Take 17 mg by mouth 2 (two) times daily.   QUEtiapine 25 MG tablet Commonly known as:  SEROQUEL TAKE 1 TABLET(25 MG) BY MOUTH TWICE DAILY      Follow-up Information    BADGER,MICHAEL C, MD. Schedule an appointment as soon as possible for a visit in 1 week(s).   Specialty:  Family Medicine Contact information: [address] [place] [address] [phone]          Allergies  Allergen Reactions  . Erythromycin     Heart flutter  . Penicillins     Patient has no idea what her reaction is   . Red Dye   . Sulfonamide Derivatives     diarrhea  . Latex Rash  . Neosporin [Neomycin-Bacitracin Zn-Polymyx] Rash  . Polysporin [Bacitracin-Polymyxin B] Rash    Consultations:  None   Procedures/Studies:  Dg Chest 2 View  Result Date: 08/31/2016 CLINICAL DATA:  Hypoglycemia at home.  History of diabetes. EXAM: CHEST  2 VIEW COMPARISON:  Chest radiograph August 22, 2016 FINDINGS: The cardiac silhouette is upper limits of normal in size, unchanged. Mediastinal silhouette is nonsuspicious. Bibasilar strandy densities without pleural effusion or focal consolidation. No pneumothorax. High-riding RIGHT humeral head compatible with old rotator cuff injury. Moderate degenerative changes shoulders. Moderate degenerative change of the thoracic spine. IMPRESSION: Borderline cardiomegaly.  Bibasilar atelectasis. Electronically Signed   By: Awilda Metro M.D.   On: 08/31/2016 20:53   Ct Head Wo Contrast  Result Date: 08/22/2016 CLINICAL DATA:  Fall EXAM: CT HEAD WITHOUT CONTRAST CT CERVICAL SPINE WITHOUT CONTRAST TECHNIQUE: Multidetector CT imaging  of the head and cervical spine was performed following the standard protocol without intravenous contrast. Multiplanar CT image reconstructions of the cervical spine were also generated. COMPARISON:  MRI 05/31/2016, CT brain 05/31/2016, CT neck 02/19/2009 FINDINGS: CT HEAD FINDINGS Brain: No evidence of acute infarction, hemorrhage, hydrocephalus, extra-axial collection or mass lesion/mass effect. Mild atrophy. Mild periventricular white matter small vessel changes. Stable ventricle size. Vascular: No hyperdense vessels.  Carotid artery calcifications. Skull: Normal. Negative for fracture or focal lesion. Sinuses/Orbits: Mild mucosal thickening in the ethmoid sinuses. Deviated nasal septum to the right, chronic. No acute orbital abnormality. Other: None CT CERVICAL SPINE FINDINGS Alignment: Straightening of the cervical spine. No subluxation. Facet alignment is maintained. Skull base and vertebrae: Craniovertebral junction appears intact. The vertebral body heights are normal. No fracture. Soft tissues and spinal canal: No prevertebral fluid or swelling. No visible canal hematoma. Disc levels: Multilevel degenerative disc changes, moderate at C4-C5 and moderate severe at C5-C6, C6-C7, C7-T1 and T1-T2. Posterior disc osteophyte complex at C4-C5 results in mild to moderate canal stenosis. Posterior disc osteophyte complex at C5-C6 and C6-C7 results in moderate canal stenosis.  Multilevel hypertrophic facet arthropathy results in multiple bilateral foraminal stenosis, severe on the left at C3-C4 and severe on the right at C5-C6. Upper chest: Lung apices clear. Slight decreased size of a left parotid nodule, measuring 1 point 4 cm in maximum dimension. Coarse calcification in the left lobe of thyroid. Complex cystic nodule measuring 13 mm at the thyroid isthmus. Other: None IMPRESSION: 1. No CT evidence for acute intracranial abnormality. 2. No acute fracture or malalignment of the cervical spine 3. Complex cystic nodule  measuring 1.3 cm at the thyroid isthmus. Nonemergent thyroid ultrasound may be performed as clinically indicated. Electronically Signed   By: Jasmine PangKim  Fujinaga M.D.   On: 08/22/2016 01:01   Ct Cervical Spine Wo Contrast  Result Date: 08/22/2016 CLINICAL DATA:  Fall EXAM: CT HEAD WITHOUT CONTRAST CT CERVICAL SPINE WITHOUT CONTRAST TECHNIQUE: Multidetector CT imaging of the head and cervical spine was performed following the standard protocol without intravenous contrast. Multiplanar CT image reconstructions of the cervical spine were also generated. COMPARISON:  MRI 05/31/2016, CT brain 05/31/2016, CT neck 02/19/2009 FINDINGS: CT HEAD FINDINGS Brain: No evidence of acute infarction, hemorrhage, hydrocephalus, extra-axial collection or mass lesion/mass effect. Mild atrophy. Mild periventricular white matter small vessel changes. Stable ventricle size. Vascular: No hyperdense vessels.  Carotid artery calcifications. Skull: Normal. Negative for fracture or focal lesion. Sinuses/Orbits: Mild mucosal thickening in the ethmoid sinuses. Deviated nasal septum to the right, chronic. No acute orbital abnormality. Other: None CT CERVICAL SPINE FINDINGS Alignment: Straightening of the cervical spine. No subluxation. Facet alignment is maintained. Skull base and vertebrae: Craniovertebral junction appears intact. The vertebral body heights are normal. No fracture. Soft tissues and spinal canal: No prevertebral fluid or swelling. No visible canal hematoma. Disc levels: Multilevel degenerative disc changes, moderate at C4-C5 and moderate severe at C5-C6, C6-C7, C7-T1 and T1-T2. Posterior disc osteophyte complex at C4-C5 results in mild to moderate canal stenosis. Posterior disc osteophyte complex at C5-C6 and C6-C7 results in moderate canal stenosis. Multilevel hypertrophic facet arthropathy results in multiple bilateral foraminal stenosis, severe on the left at C3-C4 and severe on the right at C5-C6. Upper chest: Lung apices  clear. Slight decreased size of a left parotid nodule, measuring 1 point 4 cm in maximum dimension. Coarse calcification in the left lobe of thyroid. Complex cystic nodule measuring 13 mm at the thyroid isthmus. Other: None IMPRESSION: 1. No CT evidence for acute intracranial abnormality. 2. No acute fracture or malalignment of the cervical spine 3. Complex cystic nodule measuring 1.3 cm at the thyroid isthmus. Nonemergent thyroid ultrasound may be performed as clinically indicated. Electronically Signed   By: Jasmine PangKim  Fujinaga M.D.   On: 08/22/2016 01:01   Dg Chest Port 1 View  Result Date: 08/22/2016 CLINICAL DATA:  Cough EXAM: PORTABLE CHEST 1 VIEW COMPARISON:  04/30/2016 FINDINGS: Mild bibasilar atelectasis. No consolidation or effusion. Stable mild enlargement of cardiomediastinum. No pneumothorax. IMPRESSION: Mild bibasilar atelectasis Electronically Signed   By: Jasmine PangKim  Fujinaga M.D.   On: 08/22/2016 00:18   Dg Shoulder Left  Result Date: 08/31/2016 CLINICAL DATA:  LEFT shoulder pain.  Hypoglycemia. EXAM: LEFT SHOULDER - 2+ VIEW COMPARISON:  None. FINDINGS: The humeral head is well-formed and located. The subacromial, glenohumeral and acromioclavicular joint spaces are intact. Moderate subacromial joint space narrowing and minimal undersurface spurring. Mild enthesopathy greater tuberosity. Moderate subacromial joint space narrowing, which can be seen with old rotator cuff injury. No destructive bony lesions. Soft tissue planes are non-suspicious. IMPRESSION: No acute osseous process. Mild to  moderate degenerative change of the shoulder. Electronically Signed   By: Awilda Metro M.D.   On: 08/31/2016 20:54     Subjective: - no chest pain, shortness of breath, no abdominal pain, nausea or vomiting.   Discharge Exam: Vitals:   09/01/16 1257 09/01/16 1304  BP:    Pulse: 75   Resp:    Temp:  98.9 F (37.2 C)   Vitals:   09/01/16 1230 09/01/16 1245 09/01/16 1257 09/01/16 1304  BP: (!) 113/48  (!) 114/41    Pulse: 73 75 75   Resp:  16    Temp:    98.9 F (37.2 C)  TempSrc:    Oral  SpO2: 99% 99% 97%    General: Pt is alert, awake, not in acute distress Cardiovascular: RRR, S1/S2 +, no rubs, no gallops Respiratory: CTA bilaterally, no wheezing, no rhonchi Abdominal: Soft, NT, ND, bowel sounds + Extremities: no edema, no cyanosis  The results of significant diagnostics from this hospitalization (including imaging, microbiology, ancillary and laboratory) are listed below for reference.     Microbiology: No results found for this or any previous visit (from the past 240 hour(s)).   Labs: BNP (last 3 results) No results for input(s): BNP in the last 8760 hours. Basic Metabolic Panel:  Recent Labs Lab 08/31/16 2058  NA 137  K 4.7  CL 105  CO2 25  GLUCOSE 75  BUN 20  CREATININE 1.02*  CALCIUM 8.8*   Liver Function Tests: No results for input(s): AST, ALT, ALKPHOS, BILITOT, PROT, ALBUMIN in the last 168 hours. No results for input(s): LIPASE, AMYLASE in the last 168 hours. No results for input(s): AMMONIA in the last 168 hours. CBC:  Recent Labs Lab 08/31/16 2058  WBC 10.5  NEUTROABS 8.2*  HGB 12.7  HCT 38.4  MCV 89.1  PLT 178   Cardiac Enzymes: No results for input(s): CKTOTAL, CKMB, CKMBINDEX, TROPONINI in the last 168 hours. BNP: Invalid input(s): POCBNP CBG:  Recent Labs Lab 09/01/16 0254 09/01/16 0503 09/01/16 0757 09/01/16 1054 09/01/16 1240  GLUCAP 161* 168* 147* 134* 163*   D-Dimer No results for input(s): DDIMER in the last 72 hours. Hgb A1c No results for input(s): HGBA1C in the last 72 hours. Lipid Profile No results for input(s): CHOL, HDL, LDLCALC, TRIG, CHOLHDL, LDLDIRECT in the last 72 hours. Thyroid function studies No results for input(s): TSH, T4TOTAL, T3FREE, THYROIDAB in the last 72 hours.  Invalid input(s): FREET3 Anemia work up No results for input(s): VITAMINB12, FOLATE, FERRITIN, TIBC, IRON, RETICCTPCT in the  last 72 hours. Urinalysis    Component Value Date/Time   COLORURINE YELLOW 09/01/2016 0308   APPEARANCEUR CLEAR 09/01/2016 0308   LABSPEC 1.014 09/01/2016 0308   PHURINE 5.0 09/01/2016 0308   GLUCOSEU NEGATIVE 09/01/2016 0308   HGBUR NEGATIVE 09/01/2016 0308   BILIRUBINUR NEGATIVE 09/01/2016 0308   KETONESUR NEGATIVE 09/01/2016 0308   PROTEINUR NEGATIVE 09/01/2016 0308   UROBILINOGEN 0.2 01/23/2015 2035   NITRITE NEGATIVE 09/01/2016 0308   LEUKOCYTESUR NEGATIVE 09/01/2016 0308   Sepsis Labs Invalid input(s): PROCALCITONIN,  WBC,  LACTICIDVEN Microbiology No results found for this or any previous visit (from the past 240 hour(s)).   Time coordinating discharge: 45 minutes  SIGNED:  Pamella Pert, MD  [facility] 09/01/2016, 3:01 PM Pager [phone]  If 7PM-7AM, please contact night-coverage [email or web address] Password [email or web address]

## 2016-09-01 NOTE — ED Notes (Signed)
Pt very disoriented at times, afraid "ceiling is leaking" and is calling her friends' names out for help. Pt reoriented, repositioned in bed, door left cracked.

## 2016-09-01 NOTE — Discharge Instructions (Signed)
Accuchecks 4 times/day, Once in AM empty stomach and then before each meal. Log in all results and show them to your primary doctor in 3-5 days. If any glucose reading is under 80 or above 300 call your primary doctor immediately.  Follow with BADGER,MICHAEL C, MD in 5-7 days  Please get a complete blood count and chemistry panel checked by your Primary MD at your next visit, and again as instructed by your Primary MD. Please get your medications reviewed and adjusted by your Primary MD.  Please request your Primary MD to go over all Hospital Tests and Procedure/Radiological results at the follow up, please get all Hospital records sent to your Prim MD by signing hospital release before you go home.  If you had Pneumonia of Lung problems at the Hospital: Please get a 2 view Chest X ray done in 6-8 weeks after hospital discharge or sooner if instructed by your Primary MD.  If you have Congestive Heart Failure: Please call your Cardiologist or Primary MD anytime you have any of the following symptoms:  1) 3 pound weight gain in 24 hours or 5 pounds in 1 week  2) shortness of breath, with or without a dry hacking cough  3) swelling in the hands, feet or stomach  4) if you have to sleep on extra pillows at night in order to breathe  Follow cardiac low salt diet and 1.5 lit/day fluid restriction.  If you have diabetes Accuchecks 4 times/day, Once in AM empty stomach and then before each meal. Log in all results and show them to your primary doctor at your next visit. If any glucose reading is under 80 or above 300 call your primary MD immediately.  If you have Seizure/Convulsions/Epilepsy: Please do not drive, operate heavy machinery, participate in activities at heights or participate in high speed sports until you have seen by Primary MD or a Neurologist and advised to do so again.  If you had Gastrointestinal Bleeding: Please ask your Primary MD to check a complete blood count within one  week of discharge or at your next visit. Your endoscopic/colonoscopic biopsies that are pending at the time of discharge, will also need to followed by your Primary MD.  Get Medicines reviewed and adjusted. Please take all your medications with you for your next visit with your Primary MD  Please request your Primary MD to go over all hospital tests and procedure/radiological results at the follow up, please ask your Primary MD to get all Hospital records sent to his/her office.  If you experience worsening of your admission symptoms, develop shortness of breath, life threatening emergency, suicidal or homicidal thoughts you must seek medical attention immediately by calling 911 or calling your MD immediately  if symptoms less severe.  You must read complete instructions/literature along with all the possible adverse reactions/side effects for all the Medicines you take and that have been prescribed to you. Take any new Medicines after you have completely understood and accpet all the possible adverse reactions/side effects.   Do not drive or operate heavy machinery when taking Pain medications.   Do not take more than prescribed Pain, Sleep and Anxiety Medications  Special Instructions: If you have smoked or chewed Tobacco  in the last 2 yrs please stop smoking, stop any regular Alcohol  and or any Recreational drug use.  Wear Seat belts while driving.  Please note You were cared for by a hospitalist during your hospital stay. If you have any questions about your  discharge medications or the care you received while you were in the hospital after you are discharged, you can call the unit and asked to speak with the hospitalist on call if the hospitalist that took care of you is not available. Once you are discharged, your primary care physician will handle any further medical issues. Please note that NO REFILLS for any discharge medications will be authorized once you are discharged, as it is  imperative that you return to your primary care physician (or establish a relationship with a primary care physician if you do not have one) for your aftercare needs so that they can reassess your need for medications and monitor your lab values.  You can reach the hospitalist office at phone [phone] or fax [phone]   If you do not have a primary care physician, you can call [phone] for a physician referral.  Activity: As tolerated with Full fall precautions use walker/cane & assistance as needed  Diet: diabetic  Disposition Home

## 2016-09-01 NOTE — Telephone Encounter (Signed)
Per Dr. Terrace ArabiaYan, keep taking Zoloft and Abilify in the mornings, as prescribed.  Restart Seroquel 25mg , one tablet at bedtime.  Spoke to Mr. Phillips ClimesBrame - he is agreeable to this plan and verbalized understanding.  She will keep her appt w/ Dr. Terrace ArabiaYan on 09/30/16.

## 2016-09-01 NOTE — ED Notes (Signed)
Pt becoming restless asking to pee. Pt has been placed on the bedpan and will not do anything then asked to come off. Pt very confused

## 2016-09-01 NOTE — ED Notes (Signed)
Placed pt on bedpan, pt did not have void or bm. Pt keeps yelling out "[name]" Pt states she wants to leave and trying to get up out of bed. Nurse was notified.

## 2016-09-10 ENCOUNTER — Telehealth: Payer: Self-pay | Admitting: Neurology

## 2016-09-10 NOTE — Telephone Encounter (Signed)
I talked with her husband, she has significant agitation,   I advise her to stop Nuplazid, start Seroquel 25mg  pqhs, may titrating to 2 tabs po qhs.  Kee abilify 2mg  and zoloft 50mg  qam

## 2016-09-14 ENCOUNTER — Other Ambulatory Visit: Payer: Self-pay | Admitting: Family Medicine

## 2016-09-14 DIAGNOSIS — R5381 Other malaise: Secondary | ICD-10-CM

## 2016-09-27 MED ORDER — QUETIAPINE FUMARATE 25 MG PO TABS: ORAL_TABLET | ORAL | 0 refills | Status: DC

## 2016-09-27 NOTE — Addendum Note (Signed)
Addended by: Lilla ShookKIRKMAN, Amarri Satterly C on: 09/27/2016 05:43 PM   Modules accepted: Orders

## 2016-09-27 NOTE — Telephone Encounter (Signed)
Dr. Terrace ArabiaYan has reviewed her chart and scans.  She has provided a new prescription for Seroquel 25mg , take 3-4 tablets at bedtime.  I have called her husband back and left message for a return call.

## 2016-09-27 NOTE — Telephone Encounter (Signed)
Tried to reach him again - he answered and is agreeable to the plan.  He is aware that the new prescription has been sent to the pharmacy.

## 2016-09-27 NOTE — Telephone Encounter (Signed)
Patients husband Skip Mayerddie Vejar called office to see if he is able to give more Seroquel to patient.  States patient has been very confused, saying they are going places that are not there with people coming to pick them up.  Please call

## 2016-09-30 ENCOUNTER — Encounter (INDEPENDENT_AMBULATORY_CARE_PROVIDER_SITE_OTHER): Payer: Self-pay

## 2016-09-30 ENCOUNTER — Ambulatory Visit (INDEPENDENT_AMBULATORY_CARE_PROVIDER_SITE_OTHER): Payer: Medicare Other | Admitting: Neurology

## 2016-09-30 ENCOUNTER — Encounter: Payer: Self-pay | Admitting: Neurology

## 2016-09-30 VITALS — BP 132/82 | HR 74 | Ht 67.0 in | Wt 223.0 lb

## 2016-09-30 DIAGNOSIS — R443 Hallucinations, unspecified: Secondary | ICD-10-CM | POA: Diagnosis not present

## 2016-09-30 DIAGNOSIS — R269 Unspecified abnormalities of gait and mobility: Secondary | ICD-10-CM

## 2016-09-30 DIAGNOSIS — R413 Other amnesia: Secondary | ICD-10-CM | POA: Diagnosis not present

## 2016-09-30 NOTE — Progress Notes (Signed)
PATIENT: Lauren Thomas DOB: 1934/11/25  REASON FOR VISIT: follow up HISTORY FROM: patient  HISTORY OF PRESENT ILLNESS: HISTORY 04/27/16: Lauren Thomas she is a 82 years old right-handed female, accompanied by her husband [name] and her house mate [name], seen in refer by her primary care physician [name] on Aug 11 2015 for evaluation of memory loss.  She had history of insulin dependent diabetes since 1997, macular degeneration, with poor vision, she could no longer read newpaer, could not tell features of face.  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  She has no family history of dementia, she denies visual hallucinations  UPDATE Oct 17th 2017: Reviewed laboratory evaluation in 2017, low normal B12 222, normal CBC, ESR 20, thyroid panel, negative RPR,  We have personally reviewed MRI brain on Apr 23 2016: mild atrophy, supratentorium disease.  She complains of visual hallucinations, people at the backyard for yard sale, she woke up few times at night, she could not read any longer, her vision is bad, she could not watch TV, "I have to invent for something to be at corner".  She is mentally busy, calling people.    UPDATE Jan 16th 2018: She came in with her extended family, 2 sons, and her ex-husband, her house mate,  She continue have mild memory trouble, gait abnormality, the most disturbing symptoms are her increased visual hallucinations, she saw people sitting at her porch,  sometimes she trying to close the door to avoid people getting to her house.  Today's Mini-Mental status 26/27, because of poor vision,  We have personally reviewed MRI of the brain November 2017, no acute abnormality, generalized atrophy  UPDATE September 30 2016: She is here to follow-up her central nervous system degenerative disorder, memory loss was parkinsonian features, possible Lewy body dementia, significant visual hallucinations, confusion spells  She was giving a trial of Sinemet 25/100 mg 3 times a day, but due to increased confusion agitations, it was stopped in early 2018, it did not help her mobility.  She was also treated with nuplazid for few month at the end of 2017, was no significant improvement of her agitation.  She was started on seroquel titrating dose, since January 2018 for worsening visual hallucination, sometimes agitations, she is also taking Abilify 2 mg every morning, Zoloft 50 mg every day.  She spent most of the time sitting the chair,  REVIEW OF SYSTEMS: Out of a complete 14 system review of symptoms, the patient complains only of the following symptoms, and all other reviewed systems are negative. Speech difficulty, memory loss, agitation, confusion, hallucinations, activity change, appetite changes  ALLERGIES: Allergies  Allergen Reactions  . Erythromycin     Heart flutter  . Penicillins     Patient has no idea what her reaction is   . Red Dye   . Sulfonamide Derivatives     diarrhea  . Latex Rash  . Neosporin [Neomycin-Bacitracin Zn-Polymyx] Rash  . Polysporin [Bacitracin-Polymyxin B] Rash    HOME  MEDICATIONS: Outpatient Medications Prior to Visit  Medication Sig Dispense Refill  . ARIPiprazole (ABILIFY) 2 MG tablet daily.    . diclofenac (VOLTAREN) 75 MG EC tablet Take 75 mg by mouth 2 (two) times daily.   0  . donepezil (ARICEPT) 10 MG tablet Take 1 tablet (10 mg total) by mouth at bedtime. 30 tablet 11  . guaiFENesin (MUCINEX) 600 MG 12 hr  tablet Take 600 mg by mouth 2 (two) times daily as needed for cough or to loosen phlegm.    Marland Kitchen HUMULIN 70/30 (70-30) 100 UNIT/ML injection Inject 20-35 Units into the skin See admin instructions. Use 35 units every morning and use 20 units every evening 10 mL 2  . hydroxypropyl methylcellulose (ISOPTO TEARS) 2.5 % ophthalmic solution Place 1 drop into both eyes 3 (three) times daily as needed for dry eyes.     . hyoscyamine (ANASPAZ) 0.125 MG TBDP disintergrating tablet Place 0.125 mg under the tongue every 6 (six) hours as needed for bladder spasms or cramping.    Marland Kitchen losartan (COZAAR) 25 MG tablet Take 25 mg by mouth daily.     . QUEtiapine (SEROQUEL) 25 MG tablet Take 3-4 tablets at bedtime. 360 tablet 0  . sertraline (ZOLOFT) 50 MG tablet daily.    . carbidopa-levodopa (SINEMET) 25-100 MG tablet Take 2 tablets by mouth 3 (three) times daily. 180 tablet 6   No facility-administered medications prior to visit.     PAST MEDICAL HISTORY: Past Medical History:  Diagnosis Date  . Anxiety   . Arthritis   . Diabetes mellitus without complication ([facility])   . Irregular heart rhythm   . Macular degeneration   . Memory loss     PAST SURGICAL HISTORY: Past Surgical History:  Procedure Laterality Date  . ABDOMINAL HYSTERECTOMY    . BACK SURGERY    . CATARACT EXTRACTION    . CHOLECYSTECTOMY    . LEFT HEART CATHETERIZATION WITH CORONARY ANGIOGRAM N/A 10/15/2013   Procedure: LEFT HEART CATHETERIZATION WITH CORONARY ANGIOGRAM;  Surgeon: Sinclair Grooms, MD;  Location: [facility] CATH LAB;  Service: Cardiovascular;  Laterality: N/A;    FAMILY HISTORY: Family History  Problem Relation Age of Onset  . Heart attack Mother   . Heart disease Mother   . Diabetes Mother   . Prostate cancer Father   . Diabetes Maternal Grandmother   . Heart disease Brother   . Lung cancer Brother     SOCIAL HISTORY: Social History   Social History  . Marital status: Legally Separated    Spouse name: N/A  . Number of  children: 3  . Years of education: BA   Occupational History  . Retired    Social History Main Topics  . Smoking status: Never Smoker  . Smokeless tobacco: Never Used  . Alcohol use No  . Drug use: No  . Sexual activity: Not on file   Other Topics Concern  . Not on file   Social History Narrative   Lives at home with her friend, [name].   Right-handed.   2-3 cups caffeine daily.      PHYSICAL EXAM  Vitals:   09/30/16 1317  BP: 132/82  Pulse: 74  Weight: 223 lb (101.2 kg)  Height: 5' 7"  (1.702 m)   Body mass index is 34.93 kg/m.    PHYSICAL EXAMNIATION:  Gen: NAD, conversant, well nourised, obese, well groomed  Cardiovascular: Regular rate rhythm, no peripheral edema, warm, nontender. Eyes: Conjunctivae clear without exudates or hemorrhage Neck: Supple, no carotid bruits. Pulmonary: Clear to auscultation bilaterally   NEUROLOGICAL EXAM:  MENTAL STATUS: Speech:    Speech is normal; fluent and spontaneous with normal comprehension.  Cognition: Mini-Mental Status Examination 23/27, animal naming 11     Orientation: She is not oriented to date, and month,    recent and remote memory: She missed 2/3 recall     Normal Attention span and concentration     Normal Language, naming, repeating,spontaneous speech, Could not due to poor vision     Fund of knowledge   CRANIAL NERVES: CN II: Pupils are round equal and briskly reactive to light.  CN III, IV, VI: extraocular movement are normal. No ptosis. CN V: Facial sensation is intact to pinprick in all 3 divisions bilaterally. Corneal responses are intact.  CN VII: Face is symmetric with normal eye closure and smile. CN VIII: Hearing is normal to rubbing fingers CN IX, X: Palate elevates symmetrically. Phonation is normal. CN XI: Head turning and shoulder shrug are intact CN XII: Tongue is midline with normal movements and no atrophy.  MOTOR: Mild bilateral limb and nuchal rigidity,  left worse than right, increased with reinforcement maneuvers. She has no significant weakness.   REFLEXES: Reflexes are  hypoactive  and symmetric at the biceps, triceps, knees, and ankles. Plantar responses are flexor.  SENSORY: Length dependent decreased to  light touch, pinprick  COORDINATION: Rapid alternating movements and fine finger movements are intact. There is no dysmetria on finger-to-nose and heel-knee-shin.    GAIT/STANCE: She needs assistance to get up from seated position, cautious, mildly unsteady   DIAGNOSTIC DATA (LABS, IMAGING, TESTING) - I reviewed patient records, labs, notes, testing and imaging myself where available.  Lab Results  Component Value Date   WBC 10.5 08/31/2016   HGB 12.7 08/31/2016   HCT 38.4 08/31/2016   MCV 89.1 08/31/2016   PLT 178 08/31/2016      Component Value Date/Time   NA 137 08/31/2016 2058   K 4.7 08/31/2016 2058   CL 105 08/31/2016 2058   CO2 25 08/31/2016 2058   GLUCOSE 75 08/31/2016 2058   BUN 20 08/31/2016 2058   CREATININE 1.02 (H) 08/31/2016 2058   CALCIUM 8.8 (L) 08/31/2016 2058   PROT 7.3 08/22/2016 0020   ALBUMIN 3.5 08/22/2016 0020   AST 17 08/22/2016 0020   ALT 14 08/22/2016 0020   ALKPHOS 68 08/22/2016 0020   BILITOT 0.5 08/22/2016 0020   GFRNONAA 51 (L) 08/31/2016 2058   GFRAA 59 (L) 08/31/2016 2058   Lab Results  Component Value Date   CHOL 155 10/13/2013   HDL 29 (L) 10/13/2013   LDLCALC 87 10/13/2013   TRIG 194 (H) 10/13/2013   CHOLHDL 5.3 10/13/2013   No results found for: HGBA1C Lab Results  Component Value Date   VITAMINB12 220 08/11/2015   Lab Results  Component Value Date   TSH 0.982 08/11/2015      ASSESSMENT AND PLAN 82 y.o. year old female  Central nervous system degenerative disorder, possible Lewy body dementia  Visual hallucinations,   Limited response to Nuplazid, stopped.  Keep Seroquel 78m, may try lower dose one tab every night.  Continue moderate  exercise   Polypharmacy treatment  Went over each medication with patient and her family, suggested her stop guaifenesin, aricept, may consider stop Abilify, lower dose of seroquel if she has no significant agitation  Marcial Pacas, M.D. Ph.D.  [facility] [place], [place] [address] Phone: [phone] Fax:      [phone]

## 2016-10-08 ENCOUNTER — Telehealth: Payer: Self-pay | Admitting: Neurology

## 2016-10-08 NOTE — Telephone Encounter (Signed)
I returned call from patient`s husband who stated she had stopped eating last 3 days and was sleepy most of the time and speech was incoherent at times. She had recently stopped abilify as per Dt Yan`s instructions ibuprofen advised him to take her to nearest urgent care and be evaluated, get lab work and be checked out, I asked him to call  Dr Terrace Arabia on Monday with update. He voiced understanding

## 2016-10-11 ENCOUNTER — Telehealth: Payer: Self-pay | Admitting: Neurology

## 2016-10-11 NOTE — Telephone Encounter (Signed)
Dr. Archer Asa is calling to discuss the patient's medications. I advised Dr. Terrace Arabia is not in the office today and he said a returned call tomorrow will be fine. He can be reached on his cell phone at [phone].

## 2016-10-12 NOTE — Telephone Encounter (Signed)
Left message for a return call.  He will call back on Dr. Zannie Cove mobile number.

## 2016-10-12 NOTE — Telephone Encounter (Signed)
Please connect me.

## 2016-10-12 NOTE — Telephone Encounter (Addendum)
I was able to talk with her psychiatrist, Dr. Donell Beers  She actually had very good response to Nuplazid, which has helped her visual hallucinations, she was also getting and Abilify, Zoloft for her depression,  Seroquel did help her sleep, also make her very drowsy.

## 2016-10-19 ENCOUNTER — Observation Stay (HOSPITAL_COMMUNITY)
Admission: EM | Admit: 2016-10-19 | Discharge: 2016-10-22 | Disposition: A | Discharge status: Skilled Nursing Facility | Payer: Medicare Other | Attending: Nephrology | Admitting: Nephrology

## 2016-10-19 ENCOUNTER — Encounter (HOSPITAL_COMMUNITY): Payer: Self-pay

## 2016-10-19 DIAGNOSIS — F0281 Dementia in other diseases classified elsewhere with behavioral disturbance: Secondary | ICD-10-CM | POA: Insufficient documentation

## 2016-10-19 DIAGNOSIS — Z79899 Other long term (current) drug therapy: Secondary | ICD-10-CM | POA: Insufficient documentation

## 2016-10-19 DIAGNOSIS — E860 Dehydration: Secondary | ICD-10-CM | POA: Insufficient documentation

## 2016-10-19 DIAGNOSIS — F02818 Dementia in other diseases classified elsewhere, unspecified severity, with other behavioral disturbance: Secondary | ICD-10-CM | POA: Diagnosis present

## 2016-10-19 DIAGNOSIS — R4182 Altered mental status, unspecified: Secondary | ICD-10-CM | POA: Diagnosis present

## 2016-10-19 DIAGNOSIS — Z66 Do not resuscitate: Secondary | ICD-10-CM | POA: Diagnosis not present

## 2016-10-19 DIAGNOSIS — F0390 Unspecified dementia without behavioral disturbance: Secondary | ICD-10-CM

## 2016-10-19 DIAGNOSIS — E669 Obesity, unspecified: Secondary | ICD-10-CM

## 2016-10-19 DIAGNOSIS — F329 Major depressive disorder, single episode, unspecified: Secondary | ICD-10-CM | POA: Insufficient documentation

## 2016-10-19 DIAGNOSIS — L8990 Pressure ulcer of unspecified site, unspecified stage: Secondary | ICD-10-CM | POA: Insufficient documentation

## 2016-10-19 DIAGNOSIS — R197 Diarrhea, unspecified: Secondary | ICD-10-CM | POA: Insufficient documentation

## 2016-10-19 DIAGNOSIS — E1169 Type 2 diabetes mellitus with other specified complication: Secondary | ICD-10-CM | POA: Diagnosis present

## 2016-10-19 DIAGNOSIS — I2510 Atherosclerotic heart disease of native coronary artery without angina pectoris: Secondary | ICD-10-CM | POA: Diagnosis not present

## 2016-10-19 DIAGNOSIS — N390 Urinary tract infection, site not specified: Secondary | ICD-10-CM | POA: Insufficient documentation

## 2016-10-19 DIAGNOSIS — G20 Parkinson's disease: Secondary | ICD-10-CM | POA: Diagnosis present

## 2016-10-19 DIAGNOSIS — I10 Essential (primary) hypertension: Secondary | ICD-10-CM | POA: Insufficient documentation

## 2016-10-19 DIAGNOSIS — E119 Type 2 diabetes mellitus without complications: Secondary | ICD-10-CM | POA: Insufficient documentation

## 2016-10-19 DIAGNOSIS — G3183 Dementia with Lewy bodies: Secondary | ICD-10-CM | POA: Insufficient documentation

## 2016-10-19 DIAGNOSIS — Z515 Encounter for palliative care: Secondary | ICD-10-CM

## 2016-10-19 LAB — CBC WITH DIFFERENTIAL/PLATELET
Basophils Absolute: 0 10*3/uL (ref 0.0–0.1)
Basophils Relative: 0 %
Eosinophils Absolute: 0.1 10*3/uL (ref 0.0–0.7)
Eosinophils Relative: 1 %
HCT: 35.1 % — ABNORMAL LOW (ref 36.0–46.0)
Hemoglobin: 11.7 g/dL — ABNORMAL LOW (ref 12.0–15.0)
LYMPHS ABS: 1.8 10*3/uL (ref 0.7–4.0)
LYMPHS PCT: 16 %
MCH: 29 pg (ref 26.0–34.0)
MCHC: 33.3 g/dL (ref 30.0–36.0)
MCV: 86.9 fL (ref 78.0–100.0)
MONOS PCT: 7 %
Monocytes Absolute: 0.8 10*3/uL (ref 0.1–1.0)
NEUTROS PCT: 76 %
Neutro Abs: 8.4 10*3/uL — ABNORMAL HIGH (ref 1.7–7.7)
Platelets: 273 10*3/uL (ref 150–400)
RBC: 4.04 MIL/uL (ref 3.87–5.11)
RDW: 13.2 % (ref 11.5–15.5)
WBC: 11.1 10*3/uL — AB (ref 4.0–10.5)

## 2016-10-19 LAB — LIPASE, BLOOD: LIPASE: 12 U/L (ref 11–51)

## 2016-10-19 LAB — COMPREHENSIVE METABOLIC PANEL
ALT: 10 U/L — AB (ref 14–54)
AST: 17 U/L (ref 15–41)
Albumin: 2.7 g/dL — ABNORMAL LOW (ref 3.5–5.0)
Alkaline Phosphatase: 72 U/L (ref 38–126)
Anion gap: 8 (ref 5–15)
BILIRUBIN TOTAL: 0.5 mg/dL (ref 0.3–1.2)
BUN: 15 mg/dL (ref 6–20)
CALCIUM: 8.7 mg/dL — AB (ref 8.9–10.3)
CO2: 25 mmol/L (ref 22–32)
CREATININE: 1.23 mg/dL — AB (ref 0.44–1.00)
Chloride: 104 mmol/L (ref 101–111)
GFR, EST AFRICAN AMERICAN: 47 mL/min — AB (ref >60)
GFR, EST NON AFRICAN AMERICAN: 41 mL/min — AB (ref >60)
Glucose, Bld: 100 mg/dL — ABNORMAL HIGH (ref 65–99)
Potassium: 4 mmol/L (ref 3.5–5.1)
Sodium: 137 mmol/L (ref 135–145)
TOTAL PROTEIN: 6.5 g/dL (ref 6.5–8.1)

## 2016-10-19 LAB — I-STAT TROPONIN, ED: TROPONIN I, POC: 0 ng/mL (ref 0.00–0.08)

## 2016-10-19 MED ORDER — SODIUM CHLORIDE 0.9 % IV BOLUS (SEPSIS)
500.0000 mL | Freq: Once | INTRAVENOUS | Status: AC
  Administered 2016-10-19: 500 mL via INTRAVENOUS

## 2016-10-19 NOTE — ED Triage Notes (Signed)
Pt BIB [facility] from home where family called reporting pt has been more lethargic than normal and had diarrhea X2 days. Family reported to EMS that pt does have Hx of UTIs, family denies any odor. Pt A&OX2.

## 2016-10-19 NOTE — ED Provider Notes (Signed)
[facility]-EMERGENCY DEPT Provider Note   CSN: [id number] Arrival date & time: 10/19/16  2116     History   Chief Complaint Chief Complaint  Patient presents with  . Altered Mental Status    HPI Lauren Thomas is a 82 y.o. female.  HPI Patient comes in by EMS. History is from son. Patient has history of it dementia. Level V caveat applies. Per son patient had increasing weakness, drowsiness and confusion over the past few days. She's been incontinent of stool. She's had several watery stools today. Foul-smelling. No fever or chills. No vomiting. Patient intermittently complaining of abdominal pain. She's not been able to get up out of bed today. No history of recent trauma. Past Medical History:  Diagnosis Date  . Anxiety   . Arthritis   . Diabetes mellitus without complication ([facility])   . Irregular heart rhythm   . Macular degeneration   . Memory loss     Patient Active Problem List   Diagnosis Date Noted  . Altered mental status 10/20/2016  . Dehydration 10/20/2016  . Pressure injury of skin 10/20/2016  . Depression 09/01/2016  . Hypoglycemia 08/31/2016  . Visual hallucination 07/27/2016  . Anxiety 05/14/2016  . Hallucinations 05/14/2016  . Lewy body dementia with behavioral disturbance 04/27/2016  . Parkinsonism ([facility]) 08/11/2015  . Abnormality of gait 08/11/2015  . Elevated troponin 10/12/2013  . Diabetes mellitus type 2 in obese ([facility]) 10/12/2013  . Palpitations 10/12/2013  . Abnormal finding on EKG 10/12/2013  . LUNG NODULE- evaluated 2012 07/17/2010  . HYPERTENSION, BENIGN 01/06/2010  . OBESITY-MORBID 12/31/2009  . CAD, NATIVE VESSEL- mild CAD at cath 2002 and coronary CT 07/11 12/31/2009  . CHEST PAIN-UNSPECIFIED 12/31/2009    Past Surgical History:  Procedure Laterality Date  . ABDOMINAL HYSTERECTOMY    . BACK SURGERY    . CATARACT EXTRACTION    . CHOLECYSTECTOMY    . LEFT HEART CATHETERIZATION WITH CORONARY ANGIOGRAM N/A 10/15/2013   Procedure: LEFT HEART  CATHETERIZATION WITH CORONARY ANGIOGRAM;  Surgeon: Lesleigh Noe, MD;  Location: [facility] CATH LAB;  Service: Cardiovascular;  Laterality: N/A;    OB History    No data available       Home Medications    Prior to Admission medications   Medication Sig Start Date End Date Taking? Authorizing Provider  carbidopa-levodopa (SINEMET IR) 25-100 MG tablet Take 1 tablet by mouth 3 (three) times daily.   Yes Historical Provider, MD  Chlorpheniramine Maleate (ALLERGY RELIEF PO) Take 1 tablet by mouth every evening.   Yes Historical Provider, MD  donepezil (ARICEPT) 10 MG tablet Take 1 tablet (10 mg total) by mouth at bedtime. Patient taking differently: Take 5 mg by mouth at bedtime.  04/27/16  Yes Levert Feinstein, MD  losartan (COZAAR) 25 MG tablet Take 25 mg by mouth daily.  04/23/16  Yes Historical Provider, MD  NUPLAZID 17 MG TABS Take 34 mg by mouth every evening. 09/24/16  Yes Historical Provider, MD  OREGANO PO Take 1 capsule by mouth daily.   Yes Historical Provider, MD  sertraline (ZOLOFT) 50 MG tablet Take 50 mg by mouth daily.  08/24/16  Yes Historical Provider, MD    Family History Family History  Problem Relation Age of Onset  . Heart attack Mother   . Heart disease Mother   . Diabetes Mother   . Prostate cancer Father   . Diabetes Maternal Grandmother   . Heart disease Brother   . Lung cancer Brother  Social History Social History  Substance Use Topics  . Smoking status: Never Smoker  . Smokeless tobacco: Never Used  . Alcohol use No     Allergies   Erythromycin; Sulfonamide derivatives; Latex; Neosporin [neomycin-bacitracin zn-polymyx]; Penicillins; Polysporin [bacitracin-polymyxin b]; and Red dye   Review of Systems Review of Systems  Unable to perform ROS: Dementia  Gastrointestinal: Positive for diarrhea.     Physical Exam Updated Vital Signs BP 104/63 (BP Location: Right Arm)   Pulse 77   Temp 99 F (37.2 C) (Oral)   Resp 20   Ht  (1.753 m)   Wt  217 lb 9.5 oz (98.7 kg)   SpO2 98%   BMI 32.13 kg/m   Physical Exam  Constitutional: She appears well-developed and well-nourished.  Drowsy but arousable. Appears dry  HENT:  Head: Normocephalic and atraumatic.  Dry mucous membranes  Eyes: EOM are normal. Pupils are equal, round, and reactive to light.  Neck: Normal range of motion. Neck supple.  Cardiovascular: Normal rate and regular rhythm.  Exam reveals no gallop and no friction rub.   No murmur heard. Pulmonary/Chest: Effort normal and breath sounds normal. No respiratory distress. She has no wheezes. She has no rales. She exhibits no tenderness.  Abdominal: Soft. Bowel sounds are normal. She exhibits no distension. There is no tenderness. There is no rebound and no guarding.  Musculoskeletal: Normal range of motion. She exhibits no edema or tenderness.  No lower extremity swelling or asymmetry.  Neurological:  Patient is confused and drowsy. 5/5 motor in all extremities. Sensation is grossly intact.  Skin: Skin is warm and dry. Capillary refill takes less than 2 seconds. No rash noted. No erythema.  Nursing note and vitals reviewed.    ED Treatments / Results  Labs (all labs ordered are listed, but only abnormal results are displayed) Labs Reviewed  CBC WITH DIFFERENTIAL/PLATELET - Abnormal; Notable for the following:       Result Value   WBC 11.1 (*)    Hemoglobin 11.7 (*)    HCT 35.1 (*)    Neutro Abs 8.4 (*)    All other components within normal limits  COMPREHENSIVE METABOLIC PANEL - Abnormal; Notable for the following:    Glucose, Bld 100 (*)    Creatinine, Ser 1.23 (*)    Calcium 8.7 (*)    Albumin 2.7 (*)    ALT 10 (*)    GFR calc non Af Amer 41 (*)    GFR calc Af Amer 47 (*)    All other components within normal limits  URINALYSIS, ROUTINE W REFLEX MICROSCOPIC - Abnormal; Notable for the following:    Leukocytes, UA MODERATE (*)    Squamous Epithelial / LPF 0-5 (*)    All other components within normal  limits  GLUCOSE, CAPILLARY - Abnormal; Notable for the following:    Glucose-Capillary 117 (*)    All other components within normal limits  CULTURE, BLOOD (ROUTINE X 2)  CULTURE, BLOOD (ROUTINE X 2)  GASTROINTESTINAL PANEL BY PCR, STOOL (REPLACES STOOL CULTURE)  C DIFFICILE QUICK SCREEN W PCR REFLEX  URINE CULTURE  LIPASE, BLOOD  RAPID URINE DRUG SCREEN, HOSP PERFORMED  GLUCOSE, CAPILLARY  I-STAT TROPOININ, ED    EKG  EKG Interpretation None       Radiology Ct Head Wo Contrast  Result Date: 10/20/2016 CLINICAL DATA:  Acute onset of altered mental status. Lethargy. Initial encounter. EXAM: CT HEAD WITHOUT CONTRAST TECHNIQUE: Contiguous axial images were obtained from the base  of the skull through the vertex without intravenous contrast. COMPARISON:  CT of the head performed 08/22/2016 FINDINGS: Brain: No evidence of acute infarction, hemorrhage, hydrocephalus, extra-axial collection or mass lesion/mass effect. Prominence of the ventricles and sulci reflects mild to moderate cortical volume loss. Mild cerebellar atrophy is noted. Scattered periventricular white matter change likely reflects small vessel ischemic microangiopathy. The brainstem and fourth ventricle are within normal limits. The basal ganglia are unremarkable in appearance. The cerebral hemispheres demonstrate grossly normal gray-white differentiation. No mass effect or midline shift is seen. Vascular: No hyperdense vessel or unexpected calcification. Skull: There is no evidence of fracture; visualized osseous structures are unremarkable in appearance. Sinuses/Orbits: The orbits are within normal limits. The paranasal sinuses and mastoid air cells are well-aerated. Other: No significant soft tissue abnormalities are seen. IMPRESSION: 1. No acute intracranial pathology seen on CT. 2. Mild to moderate cortical volume loss and scattered small vessel ischemic microangiopathy. Electronically Signed   By: Roanna Raider M.D.   On:  10/20/2016 00:45    Procedures Procedures (including critical care time)  Medications Ordered in ED Medications  carbidopa-levodopa (SINEMET IR) 25-100 MG per tablet immediate release 1 tablet (1 tablet Oral Given 10/20/16 2321)  sertraline (ZOLOFT) tablet 50 mg (50 mg Oral Given 10/20/16 1107)  donepezil (ARICEPT) tablet 5 mg (5 mg Oral Given 10/20/16 2321)  acetaminophen (TYLENOL) tablet 650 mg (not administered)    Or  acetaminophen (TYLENOL) suppository 650 mg (not administered)  0.9 %  sodium chloride infusion ( Intravenous New Bag/Given 10/20/16 2321)  enoxaparin (LOVENOX) injection 40 mg (40 mg Subcutaneous Given 10/20/16 1457)  cephALEXin (KEFLEX) capsule 500 mg (500 mg Oral Given 10/20/16 2321)  Pimavanserin Tartrate TABS 34 mg (34 mg Oral Given 10/20/16 2321)  sodium chloride 0.9 % bolus 500 mL (0 mLs Intravenous Stopped 10/19/16 2320)  sodium chloride 0.9 % bolus 500 mL (0 mLs Intravenous Stopped 10/20/16 0100)     Initial Impression / Assessment and Plan / ED Course  I have reviewed the triage vital signs and the nursing notes.  Pertinent labs & imaging results that were available during my care of the patient were reviewed by me and considered in my medical decision making (see chart for details).     Patient with increased confusion, lethargy and generalized weakness. Now unable to walk. Just also had diarrhea and incontinence. Appears to be dehydrated. Will likely need admission. Signout oncoming emergency physician pending CT head.  Final Clinical Impressions(s) / ED Diagnoses   Final diagnoses:  Altered mental status, unspecified altered mental status type  Diarrhea of presumed infectious origin    New Prescriptions Current Discharge Medication List       Loren Racer, MD 10/21/16 [phone]

## 2016-10-19 NOTE — ED Notes (Signed)
Son in waiting room.

## 2016-10-20 ENCOUNTER — Emergency Department (HOSPITAL_COMMUNITY): Payer: Medicare Other

## 2016-10-20 ENCOUNTER — Encounter (HOSPITAL_COMMUNITY): Payer: Self-pay | Admitting: Family Medicine

## 2016-10-20 DIAGNOSIS — I2510 Atherosclerotic heart disease of native coronary artery without angina pectoris: Secondary | ICD-10-CM | POA: Diagnosis not present

## 2016-10-20 DIAGNOSIS — E669 Obesity, unspecified: Secondary | ICD-10-CM | POA: Diagnosis not present

## 2016-10-20 DIAGNOSIS — F0281 Dementia in other diseases classified elsewhere with behavioral disturbance: Secondary | ICD-10-CM | POA: Diagnosis not present

## 2016-10-20 DIAGNOSIS — E860 Dehydration: Secondary | ICD-10-CM | POA: Diagnosis present

## 2016-10-20 DIAGNOSIS — E1169 Type 2 diabetes mellitus with other specified complication: Secondary | ICD-10-CM | POA: Diagnosis not present

## 2016-10-20 DIAGNOSIS — G20 Parkinson's disease: Secondary | ICD-10-CM | POA: Diagnosis not present

## 2016-10-20 DIAGNOSIS — R4182 Altered mental status, unspecified: Secondary | ICD-10-CM | POA: Diagnosis present

## 2016-10-20 DIAGNOSIS — G3183 Dementia with Lewy bodies: Secondary | ICD-10-CM

## 2016-10-20 DIAGNOSIS — I10 Essential (primary) hypertension: Secondary | ICD-10-CM | POA: Diagnosis not present

## 2016-10-20 DIAGNOSIS — L8990 Pressure ulcer of unspecified site, unspecified stage: Secondary | ICD-10-CM | POA: Insufficient documentation

## 2016-10-20 LAB — RAPID URINE DRUG SCREEN, HOSP PERFORMED
AMPHETAMINES: NOT DETECTED
BENZODIAZEPINES: NOT DETECTED
Barbiturates: NOT DETECTED
Cocaine: NOT DETECTED
OPIATES: NOT DETECTED
Tetrahydrocannabinol: NOT DETECTED

## 2016-10-20 LAB — URINALYSIS, ROUTINE W REFLEX MICROSCOPIC
BACTERIA UA: NONE SEEN
Bilirubin Urine: NEGATIVE
Glucose, UA: NEGATIVE mg/dL
HGB URINE DIPSTICK: NEGATIVE
Ketones, ur: NEGATIVE mg/dL
NITRITE: NEGATIVE
PROTEIN: NEGATIVE mg/dL
Specific Gravity, Urine: 1.016 (ref 1.005–1.030)
pH: 5 (ref 5.0–8.0)

## 2016-10-20 LAB — GLUCOSE, CAPILLARY
Glucose-Capillary: 79 mg/dL (ref 65–99)
Glucose-Capillary: 117 mg/dL — ABNORMAL HIGH (ref 65–99)

## 2016-10-20 MED ORDER — CEPHALEXIN 500 MG PO CAPS
500.0000 mg | ORAL_CAPSULE | Freq: Two times a day (BID) | ORAL | Status: DC
  Administered 2016-10-20 – 2016-10-22 (×4): 500 mg via ORAL
  Filled 2016-10-20: qty 1
  Filled 2016-10-20: qty 2
  Filled 2016-10-20 (×4): qty 1

## 2016-10-20 MED ORDER — SERTRALINE HCL 50 MG PO TABS
50.0000 mg | ORAL_TABLET | Freq: Every day | ORAL | Status: DC
  Administered 2016-10-20 – 2016-10-22 (×3): 50 mg via ORAL
  Filled 2016-10-20 (×3): qty 1

## 2016-10-20 MED ORDER — ACETAMINOPHEN 325 MG PO TABS: 650.0000 mg | ORAL_TABLET | Freq: Four times a day (QID) | ORAL | Status: DC | PRN

## 2016-10-20 MED ORDER — SODIUM CHLORIDE 0.9 % IV SOLN
INTRAVENOUS | Status: DC
  Administered 2016-10-20 – 2016-10-21 (×3): via INTRAVENOUS

## 2016-10-20 MED ORDER — DEXTROSE 5 % IV SOLN: 1.0000 g | Freq: Once | INTRAVENOUS | Status: DC

## 2016-10-20 MED ORDER — PIMAVANSERIN TARTRATE 17 MG PO TABS
34.0000 mg | ORAL_TABLET | Freq: Every evening | ORAL | Status: DC
  Administered 2016-10-20 – 2016-10-21 (×2): 34 mg via ORAL
  Filled 2016-10-20 (×3): qty 2

## 2016-10-20 MED ORDER — ENOXAPARIN SODIUM 40 MG/0.4ML ~~LOC~~ SOLN
40.0000 mg | SUBCUTANEOUS | Status: DC
  Administered 2016-10-20 – 2016-10-21 (×2): 40 mg via SUBCUTANEOUS
  Filled 2016-10-20 (×2): qty 0.4

## 2016-10-20 MED ORDER — DONEPEZIL HCL 5 MG PO TABS
5.0000 mg | ORAL_TABLET | Freq: Every day | ORAL | Status: DC
  Administered 2016-10-20: 5 mg via ORAL
  Filled 2016-10-20 (×2): qty 1

## 2016-10-20 MED ORDER — SODIUM CHLORIDE 0.9 % IV SOLN: INTRAVENOUS | Status: DC

## 2016-10-20 MED ORDER — ACETAMINOPHEN 650 MG RE SUPP: 650.0000 mg | Freq: Four times a day (QID) | RECTAL | Status: DC | PRN

## 2016-10-20 MED ORDER — PIMAVANSERIN TARTRATE 17 MG PO TABS: 34.0000 mg | ORAL_TABLET | Freq: Every evening | ORAL | Status: DC

## 2016-10-20 MED ORDER — CARBIDOPA-LEVODOPA 25-100 MG PO TABS
1.0000 | ORAL_TABLET | Freq: Three times a day (TID) | ORAL | Status: DC
  Administered 2016-10-20 – 2016-10-22 (×6): 1 via ORAL
  Filled 2016-10-20 (×7): qty 1

## 2016-10-20 MED ORDER — SODIUM CHLORIDE 0.9 % IV BOLUS (SEPSIS)
500.0000 mL | Freq: Once | INTRAVENOUS | Status: AC
  Administered 2016-10-20: 500 mL via INTRAVENOUS

## 2016-10-20 NOTE — Progress Notes (Signed)
Patient admitted after midnight, please see H&P.  Has had an overall downward trajectory for a while.  Palliative care to meet with family-- here currently with suspected UTI/delirium.   Marlin Canary DO

## 2016-10-20 NOTE — ED Notes (Signed)
Updates to Lauren Thomas [phone]

## 2016-10-20 NOTE — Evaluation (Addendum)
Occupational Therapy Evaluation Patient Details Name: Lauren Thomas MRN: [id number] DOB: [birth year] Today's Date: 10/20/2016    History of Present Illness Pt is 82 y/o female admitted secondary to altered mental status. PMH includes Dementia, parkinsons, HTN, CAD, DM, and macular degeneration.    Clinical Impression   PTA, pt lived with her boyfriend and reports that she required intermittent assistance with ADL and utilized RW for functional mobility. Pt currently able to complete seated grooming tasks at EOB with min assist and requires max +2 assistance for LB ADL and toileting hygiene. Pt would benefit from continued OT services while admitted to improve independence with ADL and functional mobility. Recommend SNF placement post-acute D/C for continued rehabilitation post-acute D/C in order to maximize independence and safety with ADL.Will continue to follow acutely.    Follow Up Recommendations  SNF;Supervision/Assistance - 24 hour    Equipment Recommendations  Other (comment) (TBD at next venue of care)    Recommendations for Other Services       Precautions / Restrictions Precautions Precautions: Fall Precaution Comments: Posterior lean Restrictions Weight Bearing Restrictions: No      Mobility Bed Mobility Overal bed mobility: Needs Assistance Bed Mobility: Supine to Sit;Sit to Supine     Supine to sit: Max assist;+2 for physical assistance Sit to supine: Max assist;+2 for physical assistance   General bed mobility comments: Max A +2 for bed mobility. Max encouragement required for participation. Max cues for sequencing.   Transfers Overall transfer level: Needs assistance Equipment used: Rolling walker (2 wheeled) Transfers: Sit to/from Stand Sit to Stand: Max assist;+2 physical assistance         General transfer comment: Attempted to stand X 2. On second attempt, able to come to full stand with max A +2 with use of bed pad. Max encouragement to  participate in standing. Stood for ~ 1 min with max A +2. Pt with fear of falling.     Balance Overall balance assessment: Needs assistance Sitting-balance support: Bilateral upper extremity supported;Feet supported Sitting balance-Leahy Scale: Poor Sitting balance - Comments: Posterior lean with periods of supervision. Pt required from min guard to mod A for sitting balance.  Postural control: Posterior lean Standing balance support: Bilateral upper extremity supported;During functional activity Standing balance-Leahy Scale: Poor Standing balance comment: max A +2 for standing balance. Heavy posterior lean.                            ADL either performed or assessed with clinical judgement   ADL Overall ADL's : Needs assistance/impaired     Grooming: Minimal assistance;Sitting;Wash/dry face   Upper Body Bathing: Minimal assistance;Sitting   Lower Body Bathing: Maximal assistance;Sit to/from stand;+2 for physical assistance   Upper Body Dressing : Minimal assistance;Sitting   Lower Body Dressing: Maximal assistance;Sit to/from stand;+2 for physical assistance     Toilet Transfer Details (indicate cue type and reason): Attempted to complete stand pivot simulated toilet transfer. Pt with significant posterior lean and fear of falling requiring max assist +2 to maintain standing. Toileting- Clothing Manipulation and Hygiene: Total assistance;Sit to/from stand         General ADL Comments: Pt able to follow commands well during seated ADL tasks and washed her face with set-up and min assist for balance at times.     Vision Patient Visual Report: No change from baseline Additional Comments: Pt able to track therapist and demonstrated midline gaze throughout session.  Perception     Praxis      Pertinent Vitals/Pain Pain Assessment: Faces Faces Pain Scale: Hurts even more Pain Location: L toes Pain Descriptors / Indicators: Sore     Hand Dominance      Extremity/Trunk Assessment Upper Extremity Assessment Upper Extremity Assessment: Generalized weakness;RUE deficits/detail;LUE deficits/detail RUE Deficits / Details: Reports pain in shoulders with movement. Limited to 0-90 degrees AROM in shoulder flexion. AROM elbow and composite grasp WFL. LUE Deficits / Details: Reports pain in shoulders with movement. Limited to 0-90 degrees AROM in shoulder flexion. AROM elbow and composite grasp WFL.   Lower Extremity Assessment Lower Extremity Assessment: Defer to PT evaluation RLE Deficits / Details: Pt grossly 3-/5 throughout, except hip flexors 2-/5. Noted tightness in heel cords.  LLE Deficits / Details: Pt reporting pain in R toes. Grossly 3-/5 throughout, except hip flexors 2-/5. Noted heel cord tightness.    Cervical / Trunk Assessment Cervical / Trunk Assessment: Kyphotic   Communication Communication Communication: No difficulties   Cognition Arousal/Alertness: Awake/alert Behavior During Therapy: WFL for tasks assessed/performed Overall Cognitive Status: History of cognitive impairments - at baseline                                 General Comments: Pt with history of dementia. Unable to recall birth date. Only oriented to person.    General Comments  Pt hesitant to participate with therapy and demonstrates significant fear of falling. She reported "I just want to be left alone" at end of session and frequently requesting to lay down during session.    Exercises Exercises: General Lower Extremity General Exercises - Lower Extremity Ankle Circles/Pumps: AROM;Both;10 reps;Supine (through partial range. ) Long Arc Quad: AROM;Both;10 reps;Seated (partial range ) Hip Flexion/Marching: AAROM;Both;10 reps;Seated (max A )   Shoulder Instructions      Home Living Family/patient expects to be discharged to:: Skilled nursing facility Living Arrangements: Spouse/significant other                                Additional Comments: Pt reports living with her son but per chart lives with her boyfriend.      Prior Functioning/Environment Level of Independence: Needs assistance  Gait / Transfers Assistance Needed: Per chart and pt report, pt uses RW for mobility. Per chart, pt was independent with walking with RW.  ADL's / Homemaking Assistance Needed: Per chart "mostly" independent with ADL. Per pt, requires assist sometimes with dressing/bathing but other times she is able to complete on her own.            OT Problem List: Decreased strength;Decreased range of motion;Decreased activity tolerance;Impaired balance (sitting and/or standing);Decreased cognition;Decreased safety awareness;Decreased knowledge of use of DME or AE;Decreased knowledge of precautions;Impaired UE functional use;Pain      OT Treatment/Interventions: Self-care/ADL training;Therapeutic exercise;DME and/or AE instruction;Therapeutic activities;Patient/family education;Balance training;Cognitive remediation/compensation;Energy conservation    OT Goals(Current goals can be found in the care plan section) Acute Rehab OT Goals Patient Stated Goal: unable to state  OT Goal Formulation: Patient unable to participate in goal setting Time For Goal Achievement: 11/03/16 Potential to Achieve Goals: Good ADL Goals Pt Will Perform Grooming: sitting;with set-up Pt Will Transfer to Toilet: with min assist;stand pivot transfer;bedside commode Pt Will Perform Toileting - Clothing Manipulation and hygiene: with min assist;sit to/from stand Pt/caregiver will Perform Home Exercise Program: Both right and  left upper extremity;Increased strength;Increased ROM;With written HEP provided;With minimal assist Additional ADL Goal #1: Pt will complete bed mobility in preparation for ADL seated at EOB with overall min assist.  OT Frequency: Min 1X/week   Barriers to D/C:            Co-evaluation PT/OT/SLP Co-Evaluation/Treatment: Yes Reason  for Co-Treatment: Complexity of the patient's impairments (multi-system involvement);To address functional/ADL transfers;Necessary to address cognition/behavior during functional activity;For patient/therapist safety PT goals addressed during session: Mobility/safety with mobility;Proper use of DME;Strengthening/ROM OT goals addressed during session: ADL's and self-care;Proper use of Adaptive equipment and DME      End of Session Equipment Utilized During Treatment: Gait belt;Rolling walker  Activity Tolerance: Patient tolerated treatment well Patient left: in bed;with call bell/phone within reach;with bed alarm set  OT Visit Diagnosis: Muscle weakness (generalized) (M62.81);Unsteadiness on feet (R26.81);Other symptoms and signs involving cognitive function                Time: 1308-6578 OT Time Calculation (min): 26 min Charges:  OT General Charges $OT Visit: 1 Procedure OT Evaluation $OT Eval Moderate Complexity: 1 Procedure G-Codes: OT G-codes **NOT FOR INPATIENT CLASS** Functional Assessment Tool Used: AM-PAC 6 Clicks Daily Activity Functional Limitation: Self care Self Care Current Status (I6962): At least 40 percent but less than 60 percent impaired, limited or restricted Self Care Goal Status (X5284): At least 1 percent but less than 20 percent impaired, limited or restricted   Doristine Section, MS OTR/L  Pager: [phone]   Andoni Busch A Merriam Brandner 10/20/2016, 11:34 AM

## 2016-10-20 NOTE — H&P (Signed)
History and Physical  Patient Name: DABNEY DEVER     ZOX:[id number]    DOB: October 13, 1934    DOA: 10/19/2016 PCP: Eartha Inch, MD  Neurology: Dr. Terrace Arabia     Patient coming from: Home  Chief Complaint: Altered mental status  HPI: TERRIANNE CAVNESS is a 82 y.o. female with a past medical history significant for dementia with hallucinations and parkinsonism on Nuplazid and Sinemet, hypertension, NIDDM and CAD who presents with altered mental status.  The patient has had a chronic decline over the last 3 or 4 years, which seems to be accelerating this winter/spring.  She is followed by Dr. Terrace Arabia from Neurology for her dementia, and takes Aricept, Nuplazid, Zoloft, and Sinemet (although there seems to be a lot of adjustments here, and even two weeks ago her Neuro note mentions completely different medicines, including Abilify, Seroquel).  Now in the last 3 days, she has gotten progressively more altered.  Finally, today, her son came to her house (she lives with a boyfriend, walks with a walker, mostly manages her own ADLs usually) and altered, having had numerous episodes of stool incontinence.  She is confused and does not know where she is, does not recognize people in the room, which is not normal for her.  She appears sluggish and is unable to stand or participate in self cares and so he brought her to the ER.  Paitent unable to participate in exam or history, and so all history collected from family.  They report diarrhea, cnofusion.  No cough, fever, chills, falls, reports of chest pain, focal weakness, slurred speech, complaints of numbness, facial asymmetry.  ED course: -Unable to obtain temperature, heart rate, respirations and BP normal -Na 137, K 4.0, Cr 1.23 (baseline 1.0), WBC 11.1K, Hgb 11.7 -Lipase normal -Troponin negative -UDS without benzodiazepines any more -UA with WBC TNTC -CT head unremarkable -BLood cultures and stool studies were obtained, urine culture was added -She was  given 1L NS and TRH were asked to evaluate for lethargy and confusion     ROS: Review of Systems  Unable to perform ROS: Dementia          Past Medical History:  Diagnosis Date  . Anxiety   . Arthritis   . Diabetes mellitus without complication (HCC)   . Irregular heart rhythm   . Macular degeneration   . Memory loss     Past Surgical History:  Procedure Laterality Date  . ABDOMINAL HYSTERECTOMY    . BACK SURGERY    . CATARACT EXTRACTION    . CHOLECYSTECTOMY    . LEFT HEART CATHETERIZATION WITH CORONARY ANGIOGRAM N/A 10/15/2013   Procedure: LEFT HEART CATHETERIZATION WITH CORONARY ANGIOGRAM;  Surgeon: Lesleigh Noe, MD;  Location[facility] C[facility] LAB;  Service: Cardiovascular;  Laterality: N/A;    Social History: Patient lives with a boyfriend.  Her POA is her exhusband.  The patient walks with a walker, only about 20-30 feet at a time.  She is not a smoker.    Allergies  Allergen Reactions  . Erythromycin Other (See Comments)    Heart flutter  . Sulfonamide Derivatives Diarrhea    diarrhea  . Latex Rash  . Neosporin [Neomycin-Bacitracin Zn-Polymyx] Rash  . Penicillins Other (See Comments)    Patient has no idea what her reaction is   . Polysporin [Bacitracin-Polymyxin B] Rash  . Red Dye Other (See Comments)    Family history: family history includes Diabetes in her maternal grandmother and mother; Heart attack  in her mother; Heart disease in her brother and mother; Lung cancer in her brother; Prostate cancer in her father.  Prior to Admission medications   Medication Sig Start Date End Date Taking? Authorizing Provider  carbidopa-levodopa (SINEMET IR) 25-100 MG tablet Take 1 tablet by mouth 3 (three) times daily.   Yes Historical Provider, MD  Chlorpheniramine Maleate (ALLERGY RELIEF PO) Take 1 tablet by mouth every evening.   Yes Historical Provider, MD  donepezil (ARICEPT) 10 MG tablet Take 1 tablet (10 mg total) by mouth at bedtime. Patient taking differently:  Take 5 mg by mouth at bedtime.  04/27/16  Yes Levert Feinstein, MD  losartan (COZAAR) 25 MG tablet Take 25 mg by mouth daily.  04/23/16  Yes Historical Provider, MD  NUPLAZID 17 MG TABS Take 34 mg by mouth every evening. 09/24/16  Yes Historical Provider, MD  OREGANO PO Take 1 capsule by mouth daily.   Yes Historical Provider, MD  sertraline (ZOLOFT) 50 MG tablet Take 50 mg by mouth daily.  08/24/16  Yes Historical Provider, MD       Physical Exam: BP (!) 101/51   Pulse 72   Resp 18   Ht  (1.702 m)   Wt 101.2 kg (223 lb)   SpO2 98%   BMI 34.93 kg/m  General appearance: Well-developed, obese elderly adult female, awake but not coherent, pulls IV nurses hair, belligerent, combative. Does not respond to direct questions.  Eyes: Anicteric, conjunctiva pink, lids and lashes normal. PERRL.    ENT: No nasal deformity, discharge, epistaxis.  Hearing unable to assess. OP dry without lesions.   Skin: Warm and dry.  No jaundice.  No suspicious rashes or lesions. Cardiac: RRR, nl S1-S2, no murmurs appreciated.  Capillary refill is brisk.  JVP not visible.  No LE edema.  Radial pulses 2+ and symmetric. Respiratory: Normal respiratory rate and rhythm.   Abdomen: Abdomen soft.  No TTP. Very obese. MSK: No deformities or effusions.  No cyanosis or clubbing. Neuro: Cranial nerves appear grossly symmetric. Speech is fluent.  Muscle strength appears equal, symmetric, moves all extremities, otherwise does not participate in exam or follow commads.    Psych: Sensorium not intact.  Attention diminished.      Labs on Admission:  I have personally reviewed following labs and imaging studies: CBC:  Recent Labs Lab 10/19/16 2234  WBC 11.1*  NEUTROABS 8.4*  HGB 11.7*  HCT 35.1*  MCV 86.9  PLT 273   Basic Metabolic Panel:  Recent Labs Lab 10/19/16 2234  NA 137  K 4.0  CL 104  CO2 25  GLUCOSE 100*  BUN 15  CREATININE 1.23*  CALCIUM 8.7*   GFR: Estimated Creatinine Clearance: 45.3 mL/min  (A) (by C-G formula based on SCr of 1.23 mg/dL (H)).  Liver Function Tests:  Recent Labs Lab 10/19/16 2234  AST 17  ALT 10*  ALKPHOS 72  BILITOT 0.5  PROT 6.5  ALBUMIN 2.7*    Recent Labs Lab 10/19/16 2234  LIPASE 12   No results for input(s): AMMONIA in the last 168 hours. Coagulation Profile: No results for input(s): INR, PROTIME in the last 168 hours. Cardiac Enzymes: No results for input(s): CKTOTAL, CKMB, CKMBINDEX, TROPONINI in the last 168 hours. BNP (last 3 results) No results for input(s): PROBNP in the last 8760 hours. HbA1C: No results for input(s): HGBA1C in the last 72 hours. CBG: No results for input(s): GLUCAP in the last 168 hours. Lipid Profile: No results for input(s): CHOL,  HDL, LDLCALC, TRIG, CHOLHDL, LDLDIRECT in the last 72 hours. Thyroid Function Tests: No results for input(s): TSH, T4TOTAL, FREET4, T3FREE, THYROIDAB in the last 72 hours. Anemia Panel: No results for input(s): VITAMINB12, FOLATE, FERRITIN, TIBC, IRON, RETICCTPCT in the last 72 hours. Sepsis Labs: Invalid input(s): PROCALCITONIN, LACTICIDVEN No results found for this or any previous visit (from the past 240 hour(s)).       Radiological Exams on Admission: Personally reviewed CT head repot: Ct Head Wo Contrast  Result Date: 10/20/2016 CLINICAL DATA:  Acute onset of altered mental status. Lethargy. Initial encounter. EXAM: CT HEAD WITHOUT CONTRAST TECHNIQUE: Contiguous axial images were obtained from the base of the skull through the vertex without intravenous contrast. COMPARISON:  CT of the head performed 08/22/2016 FINDINGS: Brain: No evidence of acute infarction, hemorrhage, hydrocephalus, extra-axial collection or mass lesion/mass effect. Prominence of the ventricles and sulci reflects mild to moderate cortical volume loss. Mild cerebellar atrophy is noted. Scattered periventricular white matter change likely reflects small vessel ischemic microangiopathy. The brainstem and  fourth ventricle are within normal limits. The basal ganglia are unremarkable in appearance. The cerebral hemispheres demonstrate grossly normal gray-white differentiation. No mass effect or midline shift is seen. Vascular: No hyperdense vessel or unexpected calcification. Skull: There is no evidence of fracture; visualized osseous structures are unremarkable in appearance. Sinuses/Orbits: The orbits are within normal limits. The paranasal sinuses and mastoid air cells are well-aerated. Other: No significant soft tissue abnormalities are seen. IMPRESSION: 1. No acute intracranial pathology seen on CT. 2. Mild to moderate cortical volume loss and scattered small vessel ischemic microangiopathy. Electronically Signed   By: Roanna Raider M.D.   On: 10/20/2016 00:45    EKG: Independently reviewed. Rate 73, QTc 443, normal sinus.    Assessment/Plan  1. Delirium:  Likely dehydration plus UTI insetting of baseline dementia with visual hallucinations on Nuplazid, Aricept and Zoloft.  I do not think medications are significant contributing, as family have been trying to taper meds.   -IVF -Cephalexin for UTI -Follow urine culture -Consult to PT/OT -Consult to Palliative Care re: overall downward trajectory, and to assist family in matching her needs with community palliative resources over the next few months   2. Hypertension:  Normotensive on admission. -Hold losartan given dehydration  3. Diabetes:  Previous diagnoses, no longer on insulin. -Twice daily glucose checks, start SSI if elevated  4. Dementia with parkinsonism and behavioral disturbance:  -Continue Sinemet -Continue Nuplazid, Zoloft, Aricept       DVT prophylaxis: Lovenox  Code Status: FULL  Family Communication: Son at bedside  Disposition Plan: Anticipate IV fluids and monitor overnight.  PT/OT and CM consult for home health versus possibly need for prolonged IVF and placement. Consults called: None ovenright Admission  status: OBS At the point of initial evaluation, it is my clinical opinion that admission for OBSERVATION is reasonable and necessary because the patient's presenting complaints in the context of their chronic conditions represent sufficient risk of deterioration or significant morbidity to constitute reasonable grounds for close observation in the hospital setting, but that the patient may be medically stable for discharge from the hospital within 24 to 48 hours.    Medical decision making: Patient seen at 2:45 AM on 10/20/2016.  The patient was discussed with Dr. Elesa Massed.  What exists of the patient's chart was reviewed in depth and summarized above.  Clinical condition: stable.        Alberteen Sam [facility] Pager [phone]

## 2016-10-20 NOTE — Progress Notes (Signed)
Patient continues to have weakness and an inability to complete ADL's without assistance. Requires 2xassist to transfer, ambulate and change position in the bed. Ability to feed self comes and goes.  Patient is oriented X 2. Will continue to monitor status. Lawson Radar

## 2016-10-20 NOTE — NC FL2 (Signed)
[place] MEDICAID FL2 LEVEL OF CARE SCREENING TOOL     IDENTIFICATION  Patient Name: Lauren Thomas Birthdate: [birth year] Sex: female Admission Date (Current Location): 10/19/2016  [facility] and [place] Number:  Producer, television/film/video and Address:  [facility], [address] [place], [place] [address]      Provider Number: [id number]  Attending Physician Name and Address:  Joseph Art, DO  Relative Name and Phone Number:       Current Level of Care: Hospital Recommended Level of Care: Skilled Nursing Facility Prior Approval Number:    Date Approved/Denied:   PASRR Number:    Discharge Plan: SNF    Current Diagnoses: Patient Active Problem List   Diagnosis Date Noted  . Altered mental status 10/20/2016  . Dehydration 10/20/2016  . Pressure injury of skin 10/20/2016  . Depression 09/01/2016  . Hypoglycemia 08/31/2016  . Visual hallucination 07/27/2016  . Anxiety 05/14/2016  . Hallucinations 05/14/2016  . Lewy body dementia with behavioral disturbance 04/27/2016  . Parkinsonism (HCC) 08/11/2015  . Abnormality of gait 08/11/2015  . Elevated troponin 10/12/2013  . Diabetes mellitus type 2 in obese (HCC) 10/12/2013  . Palpitations 10/12/2013  . Abnormal finding on EKG 10/12/2013  . LUNG NODULE- evaluated 2012 07/17/2010  . HYPERTENSION, BENIGN 01/06/2010  . OBESITY-MORBID 12/31/2009  . CAD, NATIVE VESSEL- mild CAD at cath 2002 and coronary CT 07/11 12/31/2009  . CHEST PAIN-UNSPECIFIED 12/31/2009    Orientation RESPIRATION BLADDER Height & Weight     Self, Place  Normal Continent Weight: 217 lb 9.5 oz (98.7 kg) Height:   (175.3 cm)  BEHAVIORAL SYMPTOMS/MOOD NEUROLOGICAL BOWEL NUTRITION STATUS      Continent Diet (Heart healthy/carb modified; thin fluids)  AMBULATORY STATUS COMMUNICATION OF NEEDS Skin   Extensive Assist Verbally PU Stage and Appropriate Care (Medial Buttocks)   PU Stage 2 Dressing: No Dressing                Personal Care Assistance Level of Assistance  Bathing, Feeding, Dressing Bathing Assistance: Maximum assistance Feeding assistance: Limited assistance Dressing Assistance: Maximum assistance     Functional Limitations Info  Sight, Hearing, Speech Sight Info: Adequate Hearing Info: Adequate Speech Info: Adequate    SPECIAL CARE FACTORS FREQUENCY  PT (By licensed PT), OT (By licensed OT)     PT Frequency: 2x OT Frequency: 1x            Contractures Contractures Info: Not present    Additional Factors Info  Code Status, Allergies, Psychotropic Code Status Info: Full Allergies Info: Erythromycin, Sulfonamide Derivatives, Latex, Neosporin Neomycin-bacitracin Zn-polymyx, Penicillins, Polysporin Bacitracin-polymyxin B, Red Dye Psychotropic Info: sertraline (ZOLOFT)         Current Medications (10/20/2016):  This is the current hospital active medication list Current Facility-Administered Medications  Medication Dose Route Frequency Provider Last Rate Last Dose  . 0.9 %  sodium chloride infusion   Intravenous Continuous Alberteen Sam, MD 125 mL/hr at 10/20/16 0342    . acetaminophen (TYLENOL) tablet 650 mg  650 mg Oral Q6H PRN Alberteen Sam, MD       Or  . acetaminophen (TYLENOL) suppository 650 mg  650 mg Rectal Q6H PRN Alberteen Sam, MD      . carbidopa-levodopa (SINEMET IR) 25-100 MG per tablet immediate release 1 tablet  1 tablet Oral TID Alberteen Sam, MD   1 tablet at 10/20/16 1107  . cephALEXin (KEFLEX) capsule 500 mg  500 mg  Oral Q12H Alberteen Sam, MD   500 mg at 10/20/16 1107  . donepezil (ARICEPT) tablet 5 mg  5 mg Oral QHS Alberteen Sam, MD      . enoxaparin (LOVENOX) injection 40 mg  40 mg Subcutaneous Q24H Alberteen Sam, MD   40 mg at 10/20/16 1457  . Pimavanserin Tartrate TABS 34 mg  34 mg Oral QPM Alberteen Sam, MD      . sertraline (ZOLOFT) tablet 50 mg  50 mg Oral Daily Alberteen Sam,  MD   50 mg at 10/20/16 1107     Discharge Medications: Please see discharge summary for a list of discharge medications.  Relevant Imaging Results:  Relevant Lab Results:   Additional Information SSN: [id number]  Dominic Pea, LCSW

## 2016-10-20 NOTE — Progress Notes (Addendum)
Patient arrived to unit via ED staff, vitals stable, no complaints. Patient oriented x2. Admission completed with help of son. Copies of advanced directives placed in chart.  Pharmacy called about Pimavanserin Tartrate (Nuplazid) asking for family to bring in from home, RN told son Norm at bedside.  Continue to monitor patient.

## 2016-10-20 NOTE — Progress Notes (Addendum)
Patient son, Norm in to see pt, states this is her normal state and agrees that patient is much better than last night. Son brought in patient home medication, Nuplazid; taken to pharmacy.  Asking about MOST form. Continue to monitor patient.

## 2016-10-20 NOTE — Clinical Social Work Note (Signed)
CSW met with pt bedside to address consult for SNF placement. Pt in agreement with SNF placement and fax out to [facility] SNFs. CSW assessment to follow. CSW following for d/c needs.   Lauren Thomas, [place], [place] Work [phone]

## 2016-10-20 NOTE — Evaluation (Signed)
Physical Therapy Evaluation Patient Details Name: Lauren Thomas MRN: [id number] DOB: 1935/04/24 Today's Date: 10/20/2016   History of Present Illness  Pt is 82 y/o female admitted secondary to altered mental status. PMH includes Dementia, parkinsons, HTN, CAD, DM, and macular degeneration.   Clinical Impression  Pt admitted with above diagnosis. Pt currently with functional limitations due to the deficits listed below (see PT Problem List). PTA, pt had hx of dementia at baseline. Per chart, pt was mostly independent with RW at baseline. Upon evaluation, no caregiver present to determine baseline functioning. Pt requiring max A +2 for bed mobility and transfers. Pt pleasant, but resistive to therapy and very fearful of falling. Recommending SNF at d/c to increase functional mobility independence.  Pt will benefit from skilled PT to increase their independence and safety with mobility to allow discharge to the venue listed below.       Follow Up Recommendations SNF;Supervision/Assistance - 24 hour    Equipment Recommendations  None recommended by PT    Recommendations for Other Services       Precautions / Restrictions Precautions Precautions: Fall Precaution Comments: Posterior lean Restrictions Weight Bearing Restrictions: No      Mobility  Bed Mobility Overal bed mobility: Needs Assistance Bed Mobility: Supine to Sit;Sit to Supine     Supine to sit: Max assist;+2 for physical assistance Sit to supine: Max assist;+2 for physical assistance   General bed mobility comments: Max A +2 for bed mobility. Max encouragement required for participation. Max cues for sequencing.   Transfers Overall transfer level: Needs assistance Equipment used: Rolling walker (2 wheeled) Transfers: Sit to/from Stand Sit to Stand: Max assist;+2 physical assistance         General transfer comment: Attempted to stand X 2. On second attempt, able to come to full stand with max A +2 with use of  bed pad. Max encouragement to participate in standing. Stood for ~ 1 min with max A +2. Pt with fear of falling.   Ambulation/Gait                Stairs            Wheelchair Mobility    Modified Rankin (Stroke Patients Only)       Balance Overall balance assessment: Needs assistance Sitting-balance support: Bilateral upper extremity supported;Feet supported Sitting balance-Leahy Scale: Poor Sitting balance - Comments: Posterior lean with periods of supervision. Pt required from min guard to mod A for sitting balance.  Postural control: Posterior lean Standing balance support: Bilateral upper extremity supported;During functional activity Standing balance-Leahy Scale: Poor Standing balance comment: max A +2 for standing balance. Heavy posterior lean.                              Pertinent Vitals/Pain Pain Assessment: Faces Faces Pain Scale: Hurts even more Pain Location: L toes Pain Descriptors / Indicators: Sore    Home Living Family/patient expects to be discharged to:: Skilled nursing facility Living Arrangements: Spouse/significant other               Additional Comments: Pt reports living with her son but per chart lives with her boyfriend.    Prior Function Level of Independence: Needs assistance   Gait / Transfers Assistance Needed: Per chart and pt report, pt uses RW for mobility. Per chart, pt was independent with walking with RW.   ADL's / Homemaking Assistance Needed: Per chart "mostly" independent with  ADL. Per pt, requires assist sometimes with dressing/bathing but other times she is able to complete on her own.        Hand Dominance        Extremity/Trunk Assessment   Upper Extremity Assessment Upper Extremity Assessment: Defer to OT evaluation    Lower Extremity Assessment Lower Extremity Assessment: LLE deficits/detail;RLE deficits/detail RLE Deficits / Details: Pt grossly 3-/5 throughout, except hip flexors 2-/5.  Noted tightness in heel cords.  LLE Deficits / Details: Pt reporting pain in R toes. Grossly 3-/5 throughout, except hip flexors 2-/5. Noted heel cord tightness.     Cervical / Trunk Assessment Cervical / Trunk Assessment: Kyphotic  Communication   Communication: No difficulties  Cognition Arousal/Alertness: Awake/alert Behavior During Therapy: WFL for tasks assessed/performed Overall Cognitive Status: History of cognitive impairments - at baseline                                 General Comments: Pt with history of dementia. Unable to recall birth date. Only oriented to person.       General Comments General comments (skin integrity, edema, etc.): Pt resistive to therapy and requesting multiple times to lay down. Pt with fear of falling and required max cues that pt was safe. At end of session, pt reporting "I just want to be left alone."    Exercises General Exercises - Lower Extremity Ankle Circles/Pumps: AROM;Both;10 reps;Supine (through partial range. ) Long Arc Quad: AROM;Both;10 reps;Seated (partial range ) Hip Flexion/Marching: AAROM;Both;10 reps;Seated (max A )   Assessment/Plan    PT Assessment Patient needs continued PT services  PT Problem List Decreased strength;Decreased range of motion;Decreased activity tolerance;Decreased balance;Decreased mobility;Decreased coordination;Decreased cognition;Decreased knowledge of use of DME;Decreased safety awareness;Pain;Decreased knowledge of precautions       PT Treatment Interventions DME instruction;Gait training;Functional mobility training;Therapeutic activities;Therapeutic exercise;Balance training;Neuromuscular re-education;Patient/family education    PT Goals (Current goals can be found in the Care Plan section)  Acute Rehab PT Goals Patient Stated Goal: unable to state  PT Goal Formulation: With patient Time For Goal Achievement: 11/03/16 Potential to Achieve Goals: Fair    Frequency Min 2X/week    Barriers to discharge Other (comment) Unsure of current caregiver support at home. No caregiver present.     Co-evaluation PT/OT/SLP Co-Evaluation/Treatment: Yes Reason for Co-Treatment: Complexity of the patient's impairments (multi-system involvement);For patient/therapist safety;To address functional/ADL transfers;Necessary to address cognition/behavior during functional activity PT goals addressed during session: Mobility/safety with mobility;Proper use of DME;Strengthening/ROM         End of Session Equipment Utilized During Treatment: Gait belt Activity Tolerance: Other (comment) (limited by fear of falling ) Patient left: in bed;with call bell/phone within reach;with bed alarm set Nurse Communication: Mobility status PT Visit Diagnosis: Other abnormalities of gait and mobility (R26.89);Muscle weakness (generalized) (M62.81)    Time: 1610-9604 PT Time Calculation (min) (ACUTE ONLY): 26 min   Charges:   PT Evaluation $PT Eval Moderate Complexity: 1 Procedure     PT G Codes:   PT G-Codes **NOT FOR INPATIENT CLASS** Functional Assessment Tool Used: AM-PAC 6 Clicks Basic Mobility;Clinical judgement Functional Limitation: Mobility: Walking and moving around Mobility: Walking and Moving Around Current Status (V4098): 100 percent impaired, limited or restricted Mobility: Walking and Moving Around Goal Status (J1914): At least 40 percent but less than 60 percent impaired, limited or restricted    Margot Chimes, PT, DPT  Acute Rehabilitation Services  Pager: [phone]  Melvyn Novas 10/20/2016, 10:48 AM

## 2016-10-20 NOTE — ED Provider Notes (Signed)
12:30 AM  Assumed care from Dr. Ranae Palms.  Patient is a 82 year old female who presents emergency department with altered mental status. Patient's son reports multiple pulsatile foul-smelling diarrhea as well as stool cultures pending. Urinalysis pending. CT head pending. Plan was for admission. Patient receiving IV fluids. Abdominal exam benign.  2:30 AM  Pt's head CT shows no acute abnormality. Urine shows too numerous to count white blood cells but no bacteria. I have sent a urine culture. Family reports foul-smelling urine. Will give dose of IV ceftriaxone. She has not had any diarrhea while here in the emergency department. Drug screen is negative. Blood cultures pending. Family is concerned over rapid decline over the past month. They state that symptoms are much worse over the past 24 hours. Patient unable to walk, bathe or feed herself.    3:09 AM Hospitalist, Dr. Maryfrances Bunnell has seen patient.  I have recommended admission and patient (and family if present) agree with this plan. Admitting physician will place admission orders.   I reviewed all nursing notes, vitals, pertinent previous records, EKGs, lab and urine results, imaging (as available).     Lauren Maw Mohamud Mrozek, DO 10/20/16 [phone]

## 2016-10-20 NOTE — Progress Notes (Signed)
Patient ID: Lauren Thomas, female   DOB: 1935-04-15, 82 y.o.   MRN: [id number]   Thank you for consulting the Palliative Medicine Team at [facility] to meet your patient's and family's needs.    We have scheduled your patient for a meeting tomorrow morning ( 10-21-16) with husband and three sons at 1100  Lorinda Creed NP  Palliative Medicine Team Team Phone # [phone] Pager [phone]   No charge

## 2016-10-21 DIAGNOSIS — N390 Urinary tract infection, site not specified: Secondary | ICD-10-CM

## 2016-10-21 DIAGNOSIS — F0390 Unspecified dementia without behavioral disturbance: Secondary | ICD-10-CM

## 2016-10-21 DIAGNOSIS — G3109 Other frontotemporal dementia: Secondary | ICD-10-CM | POA: Diagnosis not present

## 2016-10-21 DIAGNOSIS — F0281 Dementia in other diseases classified elsewhere with behavioral disturbance: Secondary | ICD-10-CM | POA: Diagnosis not present

## 2016-10-21 DIAGNOSIS — Z515 Encounter for palliative care: Secondary | ICD-10-CM | POA: Diagnosis not present

## 2016-10-21 DIAGNOSIS — R4182 Altered mental status, unspecified: Secondary | ICD-10-CM | POA: Diagnosis not present

## 2016-10-21 DIAGNOSIS — Z66 Do not resuscitate: Secondary | ICD-10-CM | POA: Diagnosis not present

## 2016-10-21 LAB — CBC
HEMATOCRIT: 32.7 % — AB (ref 36.0–46.0)
Hemoglobin: 10.5 g/dL — ABNORMAL LOW (ref 12.0–15.0)
MCH: 28 pg (ref 26.0–34.0)
MCHC: 32.1 g/dL (ref 30.0–36.0)
MCV: 87.2 fL (ref 78.0–100.0)
Platelets: 260 10*3/uL (ref 150–400)
RBC: 3.75 MIL/uL — ABNORMAL LOW (ref 3.87–5.11)
RDW: 13.1 % (ref 11.5–15.5)
WBC: 10.7 10*3/uL — ABNORMAL HIGH (ref 4.0–10.5)

## 2016-10-21 LAB — URINE CULTURE

## 2016-10-21 LAB — GLUCOSE, CAPILLARY
Glucose-Capillary: 103 mg/dL — ABNORMAL HIGH (ref 65–99)
Glucose-Capillary: 112 mg/dL — ABNORMAL HIGH (ref 65–99)

## 2016-10-21 LAB — BASIC METABOLIC PANEL
Anion gap: 10 (ref 5–15)
BUN: 10 mg/dL (ref 6–20)
CALCIUM: 8.3 mg/dL — AB (ref 8.9–10.3)
CO2: 23 mmol/L (ref 22–32)
CREATININE: 0.93 mg/dL (ref 0.44–1.00)
Chloride: 106 mmol/L (ref 101–111)
GFR calc non Af Amer: 57 mL/min — ABNORMAL LOW (ref >60)
Glucose, Bld: 115 mg/dL — ABNORMAL HIGH (ref 65–99)
Potassium: 4.3 mmol/L (ref 3.5–5.1)
Sodium: 139 mmol/L (ref 135–145)

## 2016-10-21 MED ORDER — DONEPEZIL HCL 10 MG PO TABS: 5.0000 mg | ORAL_TABLET | Freq: Every day | ORAL | Status: DC

## 2016-10-21 MED ORDER — CEPHALEXIN 500 MG PO CAPS: 500.0000 mg | ORAL_CAPSULE | Freq: Two times a day (BID) | ORAL | 0 refills | Status: DC

## 2016-10-21 NOTE — Clinical Social Work Note (Addendum)
CSW waiting on level 2 PASRR approval. Clinicals faxed to [facility] for review. Family, facility, and management made aware of delay. CSW to continue following.   Corlis Hove, LCSWA, LCASA Clinical Social Work [phone]

## 2016-10-21 NOTE — Care Management Obs Status (Signed)
MEDICARE OBSERVATION STATUS NOTIFICATION   Patient Details  Name: Lauren Thomas MRN: [id number] Date of Birth: 09/24/1934   Medicare Observation Status Notification Given:  Yes    Kermit Balo, RN 10/21/2016, 1:05 PM

## 2016-10-21 NOTE — Clinical Social Work Note (Signed)
Clinical Social Work Assessment  Patient Details  Name: Lauren Thomas MRN: [id number] Date of Birth: 02/09/1935  Date of referral:  10/21/16               Reason for consult:  Discharge Planning                Permission sought to share information with:  Facility Contact Representative Permission granted to share information::  Yes, Verbal Permission Granted  Name::        Agency::  SNFs   Relationship::     Contact Information:     Housing/Transportation Living arrangements for the past 2 months:  Single Family Home Source of Information:  Patient Patient Interpreter Needed:  None Criminal Activity/Legal Involvement Pertinent to Current Situation/Hospitalization:  No - Comment as needed Significant Relationships:  Significant Other, Adult Children Lives with:  Significant Other Do you feel safe going back to the place where you live?  Yes Need for family participation in patient care:  Yes (Comment)  Care giving concerns:  No caregiving concerns identified.    Social Worker assessment / plan:  CSW met with pt at bedside yesterday to address consult for SNF placement. CSW introduced self and explained role of social work. Pt from home with her significant other, Lauren Thomas. Pt did not give permission to speak with any family. However, pt oriented x2. P/T O/T rec SNF for STR. CSW provided pt with a SNF listing for review. Pt agreeable to SNF for STR. Pt agreed to fax out to [place] facilities. CSW completed FL-2 and faxed out to SNFs. CSW wil continue to follow for d/c needs.   Employment status:  Retired Insurance information:  Other (Comment Required) ([employer] Medicare) PT Recommendations:  Skilled Nursing Facility Information / Referral to community resources:  Skilled Nursing Facility  Patient/Family's Response to care:  Pt appeared engaged and receptive during assessment with CSW.   Patient/Family's Understanding of and Emotional Response to Diagnosis, Current  Treatment, and Prognosis:  Pt appeared to understand current treatment plan. However unsure as to pt's understanding of diagnosis and prognosis.   Emotional Assessment Appearance:  Appears stated age Attitude/Demeanor/Rapport:  Other (Appropriate) Affect (typically observed):  Accepting, Pleasant Orientation:  Oriented to Self, Oriented to Place Alcohol / Substance use:  Other Psych involvement (Current and /or in the community):  No (Comment)  Discharge Needs  Concerns to be addressed:  Care Coordination Readmission within the last 30 days:  No Current discharge risk:  Cognitively Impaired, Inadequate Financial Supports Barriers to Discharge:  Continued Medical Work up    G , LCSW 10/21/2016, 11:14 AM  

## 2016-10-21 NOTE — Care Management Note (Signed)
Case Management Note  Patient Details  Name: Lauren Thomas MRN: [id number] Date of Birth: [birth year]  Subjective/Objective:                    Action/Plan: Pt discharging to [facility] today. No further needs per CM.   Expected Discharge Date:  10/21/16               Expected Discharge Plan:  Skilled Nursing Facility  In-House Referral:  Clinical Social Work  Discharge planning Services     Post Acute Care Choice:    Choice offered to:     DME Arranged:    DME Agency:     HH Arranged:    HH Agency:     Status of Service:  Completed, signed off  If discussed at [employer], dates discussed:    Additional Comments:  Kermit Balo, RN 10/21/2016, 1:06 PM

## 2016-10-21 NOTE — Consult Note (Signed)
Consultation Note Date: 10/21/2016   Patient Name: Lauren Thomas  DOB: 1935-05-31  MRN: [id number]  Age / Sex: 82 y.o., female  PCP: Eartha Inch, MD Referring Physician: Maxie Barb, MD  Reason for Consultation: Establishing goals of care and Psychosocial/spiritual support  HPI/Patient Profile: 82 y.o. female   admitted on 10/19/2016 with  a past medical history significant for dementia with hallucinations and parkinsonism on Nuplazid and Sinemet, hypertension, NIDDM and CAD who presents with altered mental status.  The patient has had a chronic decline over the last 3 or 4 years, which seems to be accelerating.  She is followed by Dr. Terrace Arabia from Neurology for her dementia.  Family report increasing concerns over safety at home. Continued physical, functional and cognitive decline.  She is oriented only to self currently  Family is faced with advanced directive decisions and anticipatory care needs   Clinical Assessment and Goals of Care:  This NP Lorinda Creed reviewed medical records, received report from team, assessed the patient and then meet at the patient's bedside along with her husband/HPOA, two sons, and friend Billey Gosling  to discuss diagnosis, prognosis, GOC, EOL wishes disposition and options.  A detailed discussion was had today regarding advanced directives.  Concepts specific to code status, artifical feeding and hydration, continued IV antibiotics and rehospitalization was had.  The difference between a aggressive medical intervention path  and a palliative comfort care path for this patient at this time was had.  Values and goals of care important to patient and family were attempted to be elicited.  MOST form introduced  Concept of Hospice and Palliative Care were discussed  Natural trajectory and expectations at EOL were discussed.  Questions and concerns addressed.   Family  encouraged to call with questions or concerns.  PMT will continue to support holistically.     HCPOA/ ex-husband/ [name] Manago    SUMMARY OF RECOMMENDATIONS    Code Status/Advance Care Planning:  DNR- documented today   Palliative Prophylaxis:   Aspiration, Bowel Regimen, Delirium Protocol, Frequent Pain Assessment and Oral Care   Psycho-social/Spiritual:   Desire for further Chaplaincy support:no  Additional Recommendations: Education on Hospice  Prognosis:   Unable to determine  Discharge Planning:   Family is interested in [facility]--SW made aware  Skilled Nursing Facility for rehab with Palliative care service follow-up      Primary Diagnoses: Present on Admission: . Altered mental status . HYPERTENSION, BENIGN . CAD, NATIVE VESSEL- mild CAD at cath 2002 and coronary CT 07/11 . Diabetes mellitus type 2 in obese (HCC) . Parkinsonism (HCC) . Lewy body dementia with behavioral disturbance . Dehydration   I have reviewed the medical record, interviewed the patient and family, and examined the patient. The following aspects are pertinent.  Past Medical History:  Diagnosis Date  . Anxiety   . Arthritis   . Diabetes mellitus without complication (HCC)   . Irregular heart rhythm   . Macular degeneration   . Memory loss    Social History  Social History  . Marital status: Legally Separated    Spouse name: N/A  . Number of children: 3  . Years of education: BA   Occupational History  . Retired    Social History Main Topics  . Smoking status: Never Smoker  . Smokeless tobacco: Never Used  . Alcohol use No  . Drug use: No  . Sexual activity: Not Asked   Other Topics Concern  . None   Social History Narrative   Lives at home with her friend, Carmelia Roller.   Right-handed.   2-3 cups caffeine daily.   Family History  Problem Relation Age of Onset  . Heart attack Mother   . Heart disease Mother   . Diabetes Mother   . Prostate  cancer Father   . Diabetes Maternal Grandmother   . Heart disease Brother   . Lung cancer Brother    Scheduled Meds: . carbidopa-levodopa  1 tablet Oral TID  . cephALEXin  500 mg Oral Q12H  . donepezil  5 mg Oral QHS  . enoxaparin (LOVENOX) injection  40 mg Subcutaneous Q24H  . Pimavanserin Tartrate  34 mg Oral QPM  . sertraline  50 mg Oral Daily   Continuous Infusions: . sodium chloride 125 mL/hr at 10/21/16 0726   PRN Meds:.acetaminophen **OR** acetaminophen Medications Prior to Admission:  Prior to Admission medications   Medication Sig Start Date End Date Taking? Authorizing Provider  carbidopa-levodopa (SINEMET IR) 25-100 MG tablet Take 1 tablet by mouth 3 (three) times daily.   Yes Historical Provider, MD  Chlorpheniramine Maleate (ALLERGY RELIEF PO) Take 1 tablet by mouth every evening.   Yes Historical Provider, MD  donepezil (ARICEPT) 10 MG tablet Take 1 tablet (10 mg total) by mouth at bedtime. Patient taking differently: Take 5 mg by mouth at bedtime.  04/27/16  Yes Levert Feinstein, MD  losartan (COZAAR) 25 MG tablet Take 25 mg by mouth daily.  04/23/16  Yes Historical Provider, MD  NUPLAZID 17 MG TABS Take 34 mg by mouth every evening. 09/24/16  Yes Historical Provider, MD  OREGANO PO Take 1 capsule by mouth daily.   Yes Historical Provider, MD  sertraline (ZOLOFT) 50 MG tablet Take 50 mg by mouth daily.  08/24/16  Yes Historical Provider, MD   Allergies  Allergen Reactions  . Erythromycin Other (See Comments)    Heart flutter  . Sulfonamide Derivatives Diarrhea    diarrhea  . Latex Rash  . Neosporin [Neomycin-Bacitracin Zn-Polymyx] Rash  . Penicillins Other (See Comments)    Patient has no idea what her reaction is   . Polysporin [Bacitracin-Polymyxin B] Rash  . Red Dye Other (See Comments)   Review of Systems  Unable to perform ROS: Dementia    Physical Exam  Constitutional: She is cooperative. She appears ill.  HENT:  Mouth/Throat: Oropharynx is clear and  moist.  Cardiovascular: Normal rate, regular rhythm and normal heart sounds.   Pulmonary/Chest: She has decreased breath sounds in the right lower field and the left lower field.  Neurological: She is alert.  oriented only to person  Skin: Skin is warm and dry.    Vital Signs: BP (!) 141/47 (BP Location: Left Arm)   Pulse 76   Temp 98.6 F (37 C) (Oral)   Resp 20   Ht  (1.753 m)   Wt 98.7 kg (217 lb 9.5 oz)   SpO2 94%   BMI 32.13 kg/m  Pain Assessment: No/denies pain   Pain Score: 0-No pain  SpO2: SpO2: 94 % O2 Device:SpO2: 94 % O2 Flow Rate: .   IO: Intake/output summary: No intake or output data in the 24 hours ending 10/21/16 1033  LBM: Last BM Date: 10/20/16 Baseline Weight: Weight: 101.2 kg (223 lb) Most recent weight: Weight: 98.7 kg (217 lb 9.5 oz)     Palliative Assessment/Data: 30 % at best   Discussed with SW  Time In: 1030 Time Out: 1145 Time Total: 75 min Greater than 50%  of this time was spent counseling and coordinating care related to the above assessment and plan.  Signed by: Lorinda Creed, NP   Please contact Palliative Medicine Team phone at [phone] for questions and concerns.  For individual provider: See Loretha Stapler

## 2016-10-21 NOTE — Discharge Summary (Addendum)
Physician Discharge Summary  Lauren Thomas:[id number] DOB: January 29, 1935 DOA: 10/19/2016  PCP: Eartha Inch, MD  Admit date: 10/19/2016 Discharge date: 10/22/2016  Admitted From:home Disposition:SNF  Recommendations for Outpatient Follow-up:  1. Follow up with PCP in 1-2 weeks 2. Please obtain BMP/CBC in one week   Home Health:SNF Equipment/Devices:none Discharge Condition:stable CODE STATUS:DNR Diet recommendation:heart healthy  Brief/Interim Summary: 82 y.o. female with a past medical history significant for dementia with hallucinations and parkinsonism on Nuplazid and Sinemet, hypertension, NIDDM and CAD who presents with altered mental status.The patient has had a chronic decline over the last 3 or 4 years, which seems to be accelerating this winter/spring.  She is followed by Dr. Terrace Arabia from Neurology for her dementia.  In the ER patient was found to have UTI. Head CT was unremarkable.  Patient likely delirium in the setting of UTI in underlying dementia with visual hallucination. She was treated with cephalexin for UTI, site unspecified. Pending culture results. I think her mental status change is gradual in nature and progressively worsening. Evaluated by palliative care. Patient is DO NOT RESUSCITATE. Continue home medications for the management of hypertension and her dementia with behavioral disturbance. Patient is clinically stable. Plan to discharge with oral antibiotics. Discussed the case Production designer, theatre/television/film.  I recommended patient to follow-up with her PCP and neurologist. Continue supportive care.   Discharge Diagnoses:  Principal Problem:   Altered mental status Active Problems:   HYPERTENSION, BENIGN   CAD, NATIVE VESSEL- mild CAD at cath 2002 and coronary CT 07/11   Diabetes mellitus type 2 in obese (HCC)   Parkinsonism (HCC)   Lewy body dementia with behavioral disturbance   Dehydration   Pressure injury of skin, unspecified    Discharge Instructions  Discharge  Instructions    Amb Referral to Palliative Care    Complete by:  As directed    Call MD for:  difficulty breathing, headache or visual disturbances    Complete by:  As directed    Call MD for:  extreme fatigue    Complete by:  As directed    Call MD for:  hives    Complete by:  As directed    Call MD for:  persistant dizziness or light-headedness    Complete by:  As directed    Call MD for:  persistant nausea and vomiting    Complete by:  As directed    Call MD for:  severe uncontrolled pain    Complete by:  As directed    Call MD for:  temperature >100.4    Complete by:  As directed    Diet - low sodium heart healthy    Complete by:  As directed    Increase activity slowly    Complete by:  As directed      Allergies as of 10/22/2016      Reactions   Erythromycin Other (See Comments)   Heart flutter   Sulfonamide Derivatives Diarrhea   diarrhea   Latex Rash   Neosporin [neomycin-bacitracin Zn-polymyx] Rash   Penicillins Other (See Comments)   Patient has no idea what her reaction is   Polysporin [bacitracin-polymyxin B] Rash   Red Dye Other (See Comments)      Medication List    TAKE these medications   ALLERGY RELIEF PO Take 1 tablet by mouth every evening.   carbidopa-levodopa 25-100 MG tablet Commonly known as:  SINEMET IR Take 1 tablet by mouth 3 (three) times daily.   cephALEXin 500 MG capsule  Commonly known as:  KEFLEX Take 1 capsule (500 mg total) by mouth every 12 (twelve) hours. Continue till 10/25/2016   donepezil 10 MG tablet Commonly known as:  ARICEPT Take 0.5 tablets (5 mg total) by mouth at bedtime.   losartan 25 MG tablet Commonly known as:  COZAAR Take 25 mg by mouth daily.   NUPLAZID 17 MG Tabs Generic drug:  Pimavanserin Tartrate Take 34 mg by mouth every evening.   OREGANO PO Take 1 capsule by mouth daily.   sertraline 50 MG tablet Commonly known as:  ZOLOFT Take 50 mg by mouth daily.       Contact information for follow-up  providers    BADGER,MICHAEL C, MD. Schedule an appointment as soon as possible for a visit in 1 week(s).   Specialty:  Family Medicine Contact information: [address] [place] [address] [phone]            Contact information for after-discharge care    Destination    [facility] Follow up.   Specialty:  Skilled Nursing Facility Contact information: [address] [place] [address] [phone]                 Allergies  Allergen Reactions  . Erythromycin Other (See Comments)    Heart flutter  . Sulfonamide Derivatives Diarrhea    diarrhea  . Latex Rash  . Neosporin [Neomycin-Bacitracin Zn-Polymyx] Rash  . Penicillins Other (See Comments)    Patient has no idea what her reaction is   . Polysporin [Bacitracin-Polymyxin B] Rash  . Red Dye Other (See Comments)    Consultations: Palliative care  Procedures/Studies: none  Subjective: Patient was seen and examined at bedside. She was alert awake and oriented to herself. Denied headache, dizziness, nausea, vomiting, chest pain or shortness of breath.  Discharge Exam: Vitals:   10/22/16 0830 10/22/16 1005  BP: 138/60 (!) 142/59  Pulse: 67 70  Resp: 18 18  Temp: 97.6 F (36.4 C) 97.8 F (36.6 C)   Vitals:   10/22/16 0106 10/22/16 0604 10/22/16 0830 10/22/16 1005  BP: 134/67 132/65 138/60 (!) 142/59  Pulse: 80 73 67 70  Resp: Temp:  97.5 F (36.4 C) 97.6 F (36.4 C) 97.8 F (36.6 C)  TempSrc:  Axillary Axillary Axillary  SpO2: 99% 97% 98% 98%  Weight:      Height:        General: Elderly female lying on bed comfortable, not in distress Cardiovascular: RRR, S1/S2 +, no rubs, no gallops Respiratory: CTA bilaterally, no wheezing, no rhonchi Abdominal: Soft, NT, ND, bowel sounds + Extremities: no edema, no cyanosis    The results of significant diagnostics from this hospitalization (including imaging, microbiology, ancillary and laboratory) are  listed below for reference.     Microbiology: Recent Results (from the past 240 hour(s))  Culture, blood (Routine X 2) w Reflex to ID Panel     Status: None (Preliminary result)   Collection Time: 10/19/16 10:34 PM  Result Value Ref Range Status   Specimen Description BLOOD LEFT ARM  Final   Special Requests AEROBIC BOTTLE ONLY Blood Culture adequate volume  Final   Culture NO GROWTH 2 DAYS  Final   Report Status PENDING  Incomplete  Culture, blood (Routine X 2) w Reflex to ID Panel     Status: None (Preliminary result)   Collection Time: 10/19/16 10:35 PM  Result Value Ref Range Status   Specimen Description BLOOD LEFT  ARM  Final   Special Requests   Final    BOTTLES DRAWN AEROBIC AND ANAEROBIC Blood Culture adequate volume   Culture NO GROWTH 2 DAYS  Final   Report Status PENDING  Incomplete  Urine culture     Status: Abnormal   Collection Time: 10/20/16  2:12 AM  Result Value Ref Range Status   Specimen Description URINE, CATHETERIZED  Final   Special Requests NONE  Final   Culture <10,000 COLONIES/mL INSIGNIFICANT GROWTH (A)  Final   Report Status 10/21/2016 FINAL  Final     Labs: BNP (last 3 results) No results for input(s): BNP in the last 8760 hours. Basic Metabolic Panel:  Recent Labs Lab 10/19/16 2234 10/21/16 0806  NA 137 139  K 4.0 4.3  CL 104 106  CO2 25 23  GLUCOSE 100* 115*  BUN 15 10  CREATININE 1.23* 0.93  CALCIUM 8.7* 8.3*   Liver Function Tests:  Recent Labs Lab 10/19/16 2234  AST 17  ALT 10*  ALKPHOS 72  BILITOT 0.5  PROT 6.5  ALBUMIN 2.7*    Recent Labs Lab 10/19/16 2234  LIPASE 12   No results for input(s): AMMONIA in the last 168 hours. CBC:  Recent Labs Lab 10/19/16 2234 10/21/16 0806  WBC 11.1* 10.7*  NEUTROABS 8.4*  --   HGB 11.7* 10.5*  HCT 35.1* 32.7*  MCV 86.9 87.2  PLT 273 260   Cardiac Enzymes: No results for input(s): CKTOTAL, CKMB, CKMBINDEX, TROPONINI in the last 168 hours. BNP: Invalid input(s):  POCBNP CBG:  Recent Labs Lab 10/20/16 1141 10/20/16 1630 10/21/16 0623 10/21/16 1607  GLUCAP 79 117* 103* 112*   D-Dimer No results for input(s): DDIMER in the last 72 hours. Hgb A1c No results for input(s): HGBA1C in the last 72 hours. Lipid Profile No results for input(s): CHOL, HDL, LDLCALC, TRIG, CHOLHDL, LDLDIRECT in the last 72 hours. Thyroid function studies No results for input(s): TSH, T4TOTAL, T3FREE, THYROIDAB in the last 72 hours.  Invalid input(s): FREET3 Anemia work up No results for input(s): VITAMINB12, FOLATE, FERRITIN, TIBC, IRON, RETICCTPCT in the last 72 hours. Urinalysis    Component Value Date/Time   COLORURINE YELLOW 10/20/2016 0129   APPEARANCEUR CLEAR 10/20/2016 0129   LABSPEC 1.016 10/20/2016 0129   PHURINE 5.0 10/20/2016 0129   GLUCOSEU NEGATIVE 10/20/2016 0129   HGBUR NEGATIVE 10/20/2016 0129   BILIRUBINUR NEGATIVE 10/20/2016 0129   KETONESUR NEGATIVE 10/20/2016 0129   PROTEINUR NEGATIVE 10/20/2016 0129   UROBILINOGEN 0.2 01/23/2015 2035   NITRITE NEGATIVE 10/20/2016 0129   LEUKOCYTESUR MODERATE (A) 10/20/2016 0129   Sepsis Labs Invalid input(s): PROCALCITONIN,  WBC,  LACTICIDVEN Microbiology Recent Results (from the past 240 hour(s))  Culture, blood (Routine X 2) w Reflex to ID Panel     Status: None (Preliminary result)   Collection Time: 10/19/16 10:34 PM  Result Value Ref Range Status   Specimen Description BLOOD LEFT ARM  Final   Special Requests AEROBIC BOTTLE ONLY Blood Culture adequate volume  Final   Culture NO GROWTH 2 DAYS  Final   Report Status PENDING  Incomplete  Culture, blood (Routine X 2) w Reflex to ID Panel     Status: None (Preliminary result)   Collection Time: 10/19/16 10:35 PM  Result Value Ref Range Status   Specimen Description BLOOD LEFT ARM  Final   Special Requests   Final    BOTTLES DRAWN AEROBIC AND ANAEROBIC Blood Culture adequate volume   Culture NO GROWTH 2 DAYS  Final   Report Status PENDING   Incomplete  Urine culture     Status: Abnormal   Collection Time: 10/20/16  2:12 AM  Result Value Ref Range Status   Specimen Description URINE, CATHETERIZED  Final   Special Requests NONE  Final   Culture <10,000 COLONIES/mL INSIGNIFICANT GROWTH (A)  Final   Report Status 10/21/2016 FINAL  Final     Time coordinating discharge: 26 minutes  SIGNED:   Maxie Barb, MD  [facility] 10/22/2016, 11:05 AM  If 7PM-7AM, please contact night-coverage [email or web address] Password [email or web address]

## 2016-10-22 MED ORDER — CEPHALEXIN 500 MG PO CAPS: 500.0000 mg | ORAL_CAPSULE | Freq: Two times a day (BID) | ORAL | 0 refills | Status: AC

## 2016-10-22 NOTE — Clinical Social Work Placement (Signed)
   CLINICAL SOCIAL WORK PLACEMENT  NOTE  Date:  10/22/2016  Patient Details  Name: Lauren Thomas MRN: [id number] Date of Birth: 03-20-1935  Clinical Social Work is seeking post-discharge placement for this patient at the Skilled  Nursing Facility level of care (*CSW will initial, date and re-position this form in  chart as items are completed):  Yes   Patient/family provided with [facility] Clinical Social Work Department's list of facilities offering this level of care within the geographic area requested by the patient (or if unable, by the patient's family).  Yes   Patient/family informed of their freedom to choose among providers that offer the needed level of care, that participate in Medicare, Medicaid or managed care program needed by the patient, have an available bed and are willing to accept the patient.  Yes   Patient/family informed of 's ownership interest in [facility] and [facility], as well as of the fact that they are under no obligation to receive care at these facilities.  PASRR submitted to EDS on 10/21/16     PASRR number received on 10/22/16     Existing PASRR number confirmed on       FL2 transmitted to all facilities in geographic area requested by pt/family on 10/21/16     FL2 transmitted to all facilities within larger geographic area on       Patient informed that his/her managed care company has contracts with or will negotiate with certain facilities, including the following:        Yes   Patient/family informed of bed offers received.  Patient chooses bed at [facility]     Physician recommends and patient chooses bed at      Patient to be transferred to [facility] on 10/22/16.  Patient to be transferred to facility by [facility]     Patient family notified on 10/22/16 of transfer.  Name of family member notified:  Lauren Thomas     PHYSICIAN Please prepare priority discharge summary, including medications     Additional  Comment:  Pt is ready for discharge today and will go to [facility]. Pt and ex-husband/POA Lauren Thomas are aware and agreeable to discharge plan. CSW sent clinicals to [facility] and communicated with Kirstin (admissions) for room and report. Room and report put in treatment team sticky note and RN made aware. Transportation arranged. CSW is signing off as no further needs identified.  _______________________________________________ Dominic Pea, LCSW 10/22/2016, 1:25 PM

## 2016-10-22 NOTE — Progress Notes (Signed)
Attempted x2 to call report to nurse at [facility] but was unsuccessful.

## 2016-10-22 NOTE — Progress Notes (Signed)
The patient was seen and examined at bedside. No new event from yesterday. Discharge planning, ordered and discharge summary was done yesterday. Patient states year because of waiting for level 2 PASRR approval. As per social worker that approval was obtained this morning. No change in medical management. Patient will be discharged today. She will be followed up at the skilled nursing facility.

## 2016-10-22 NOTE — Progress Notes (Signed)
Occupational Therapy Treatment Patient Details Name: MERLINE PERKIN MRN: [id number] DOB: 07/09/1935 Today's Date: 10/22/2016    History of present illness Pt is 82 y/o female admitted secondary to altered mental status. PMH includes Dementia, parkinsons, HTN, CAD, DM, and macular degeneration.    OT comments  Pt participation in treatment session limited secondary to fatigue and difficulty maintaining eyes open.  Attempted to engage pt in activity seated EOB, however pt refusing.  Therefore increased lighting to improve level of alertness.  Pt unable to engage in self-feeding secondary to decreased arousal and reports of diminished hunger.  Required setup to cut fruit and max encouragement for self-feeding.  Pt attempting to sip out of cup despite straw, decreased attention to task and awareness.  Provided pt with yellow theraband and educated on shoulder flexion exercises and chest presses 1 set of 10 each to increase overall BUE strengthening.  Pt will benefit from continued OT acutely in preparation for transfer to SNF to increase independence in ADLs and functional mobility.  Follow Up Recommendations  SNF;Supervision/Assistance - 24 hour    Equipment Recommendations  Other (comment) (defer to next venue of care)    Recommendations for Other Services      Precautions / Restrictions Precautions Precautions: Fall Precaution Comments: Posterior lean              ADL either performed or assessed with clinical judgement   ADL       Grooming: Minimal assistance;Wash/dry face;Bed level                                 General ADL Comments: Pt very fatigued throughout session, requiring increased cues to remain alert and engaged               Cognition Arousal/Alertness: Awake/alert   Overall Cognitive Status: History of cognitive impairments - at baseline                                 General Comments: Pt with history of dementia. Unable to  recall birth date. Only oriented to person. Thought she was in DC        Exercises General Exercises - Upper Extremity Shoulder Flexion: Theraband;Right;Left;10 reps;Supine Theraband Level (Shoulder Flexion): Level 1 (Yellow) Shoulder Horizontal ABduction: Right;Left;Supine;Theraband;10 reps           Pertinent Vitals/ Pain       Pain Assessment: No/denies pain         Frequency  Min 1X/week        Progress Toward Goals  OT Goals(current goals can now be found in the care plan section)  Progress towards OT goals: Progressing toward goals  Acute Rehab OT Goals Patient Stated Goal: unable to state  OT Goal Formulation: Patient unable to participate in goal setting Time For Goal Achievement: 11/03/16 Potential to Achieve Goals: Good  Plan Discharge plan remains appropriate       End of Session    OT Visit Diagnosis: Muscle weakness (generalized) (M62.81);Unsteadiness on feet (R26.81);Other symptoms and signs involving cognitive function   Activity Tolerance Patient limited by fatigue   Patient Left in bed;with call bell/phone within reach;with bed alarm set   Nurse Communication  (decreased arousal and minimal breakfast intake)    Functional Assessment Tool Used: AM-PAC 6 Clicks Daily Activity Functional Limitation: Self care Self Care Current Status (W0981):  At least 40 percent but less than 60 percent impaired, limited or restricted Self Care Goal Status (G9562): At least 1 percent but less than 20 percent impaired, limited or restricted   Time: 0758-0822 OT Time Calculation (min): 24 min  Charges: OT G-codes **NOT FOR INPATIENT CLASS** Functional Assessment Tool Used: AM-PAC 6 Clicks Daily Activity Functional Limitation: Self care Self Care Current Status (Z3086): At least 40 percent but less than 60 percent impaired, limited or restricted Self Care Goal Status (V7846): At least 1 percent but less than 20 percent impaired, limited or restricted OT General  Charges $OT Visit: 1 Procedure OT Treatments $Self Care/Home Management : 8-22 mins $Therapeutic Exercise: 8-22 mins   Rosalio Loud, [phone] 10/22/2016, 8:29 AM

## 2016-10-22 NOTE — Progress Notes (Signed)
Report given to nurse Marylene Land at [facility]. Pt transferred this am taking all personal belongings. IV discontinued, dry dressing applied. Medication administered, pt denied pain or discomfort prior to leaving via [facility]. No noted distress.

## 2016-10-24 LAB — CULTURE, BLOOD (ROUTINE X 2)
Culture: NO GROWTH
Culture: NO GROWTH
Special Requests: ADEQUATE
Special Requests: ADEQUATE

## 2016-10-25 ENCOUNTER — Encounter: Payer: Self-pay | Admitting: Internal Medicine

## 2016-10-25 ENCOUNTER — Non-Acute Institutional Stay (SKILLED_NURSING_FACILITY): Payer: Medicare Other | Admitting: Internal Medicine

## 2016-10-25 DIAGNOSIS — R531 Weakness: Secondary | ICD-10-CM | POA: Diagnosis not present

## 2016-10-25 DIAGNOSIS — N390 Urinary tract infection, site not specified: Secondary | ICD-10-CM

## 2016-10-25 DIAGNOSIS — I10 Essential (primary) hypertension: Secondary | ICD-10-CM | POA: Diagnosis not present

## 2016-10-25 DIAGNOSIS — D638 Anemia in other chronic diseases classified elsewhere: Secondary | ICD-10-CM

## 2016-10-25 DIAGNOSIS — F0281 Dementia in other diseases classified elsewhere with behavioral disturbance: Secondary | ICD-10-CM

## 2016-10-25 DIAGNOSIS — G20 Parkinson's disease: Secondary | ICD-10-CM

## 2016-10-25 DIAGNOSIS — D72825 Bandemia: Secondary | ICD-10-CM

## 2016-10-25 DIAGNOSIS — G3183 Dementia with Lewy bodies: Secondary | ICD-10-CM

## 2016-10-25 DIAGNOSIS — L89159 Pressure ulcer of sacral region, unspecified stage: Secondary | ICD-10-CM

## 2016-10-25 NOTE — Progress Notes (Signed)
LOCATION: [facility]  PCP: Eartha Inch, MD   Code Status: DNR  Goals of care: Advanced Directive information Advanced Directives 10/25/2016  Does Patient Have a Medical Advance Directive? Yes  Type of Advance Directive Out of facility DNR (pink MOST or yellow form)  Does patient want to make changes to medical advance directive? No - Patient declined  Copy of Healthcare Power of Attorney in Chart? -  Would patient like information on creating a medical advance directive? -  Pre-existing out of facility DNR order (yellow form or pink MOST form) -       Extended Emergency Contact Information Primary Emergency Contact: Lillard Anes [place] Home Phone: [phone] Relation: Son Secondary Emergency Contact: Rachael Darby [place] Mobile Phone: [phone] Relation: Son   Allergies  Allergen Reactions  . Erythromycin Other (See Comments)    Heart flutter  . Sulfonamide Derivatives Diarrhea    diarrhea  . Latex Rash  . Neosporin [Neomycin-Bacitracin Zn-Polymyx] Rash  . Penicillins Other (See Comments)    Patient has no idea what her reaction is   . Polysporin [Bacitracin-Polymyxin B] Rash  . Red Dye Other (See Comments)    Chief Complaint  Patient presents with  . New Admit To SNF    New Admission Visit      HPI:  Patient is a 82 y.o. female seen today for short term rehabilitation post hospital admission from 10/19/2016-10/22/2016 with altered mental status thought to be from UTI and her dementia. She was started on antibiotic. She has medical history of Parkinson's on is some, dementia, hypertension, coronary artery disease. She is seen in her room today.  Review of Systems:  Constitutional: Negative for fever, chills, diaphoresis. Energy level is fair.  HENT: Negative for headache, congestion,sore throat, difficulty swallowing. Positive for nasal discharge.   Eyes: Negative for double vision and discharge.    Respiratory: Negative for shortness of breath and wheezing. Positive for cough with occasional phlegm.   Cardiovascular: Negative for chest pain, palpitation.  Gastrointestinal: Negative for nausea, vomiting, abdominal pain, loss of appetite, melena, diarrhea and constipation. Positive for occasional heartburn. Last bowel movement was this morning. Genitourinary: Negative for flank pain. Positive for dysuria.  Musculoskeletal: Negative for back pain, fall in the facility.  Skin: Negative for itching, rash.  Neurological: Negative for dizziness. Psychiatric/Behavioral: Negative for depression. Positive for memory loss.   Past Medical History:  Diagnosis Date  . Anxiety   . Arthritis   . Diabetes mellitus without complication (HCC)   . Irregular heart rhythm   . Macular degeneration   . Memory loss    Past Surgical History:  Procedure Laterality Date  . ABDOMINAL HYSTERECTOMY    . BACK SURGERY    . CATARACT EXTRACTION    . CHOLECYSTECTOMY    . LEFT HEART CATHETERIZATION WITH CORONARY ANGIOGRAM N/A 10/15/2013   Procedure: LEFT HEART CATHETERIZATION WITH CORONARY ANGIOGRAM;  Surgeon: Lesleigh Noe, MD;  Location: [facility] CATH LAB;  Service: Cardiovascular;  Laterality: N/A;   Social History:   reports that she has never smoked. She has never used smokeless tobacco. She reports that she does not drink alcohol or use drugs.  Family History  Problem Relation Age of Onset  . Heart attack Mother   . Heart disease Mother   . Diabetes Mother   . Prostate cancer Father   . Diabetes Maternal Grandmother   . Heart disease Brother   . Lung cancer Brother  Medications: Allergies as of 10/25/2016      Reactions   Erythromycin Other (See Comments)   Heart flutter   Sulfonamide Derivatives Diarrhea   diarrhea   Latex Rash   Neosporin [neomycin-bacitracin Zn-polymyx] Rash   Penicillins Other (See Comments)   Patient has no idea what her reaction is   Polysporin [bacitracin-polymyxin  B] Rash   Red Dye Other (See Comments)      Medication List       Accurate as of 10/25/16  3:10 PM. Always use your most recent med list.          ALLERGY RELIEF PO Take 1 tablet by mouth every evening.   carbidopa-levodopa 25-100 MG tablet Commonly known as:  SINEMET IR Take 1 tablet by mouth 3 (three) times daily.   cephALEXin 500 MG capsule Commonly known as:  KEFLEX Take 1 capsule (500 mg total) by mouth every 12 (twelve) hours. Continue till 10/25/2016   donepezil 10 MG tablet Commonly known as:  ARICEPT Take 10 mg by mouth at bedtime.   losartan 25 MG tablet Commonly known as:  COZAAR Take 25 mg by mouth daily.   NUPLAZID 17 MG Tabs Generic drug:  Pimavanserin Tartrate Take 17 mg by mouth every evening.   sertraline 50 MG tablet Commonly known as:  ZOLOFT Take 50 mg by mouth daily.       Immunizations: There is no immunization history for the selected administration types on file for this patient.   Physical Exam:  Vitals:   10/25/16 1506  BP: (!) 143/67  Pulse: 71  Resp: 20  Temp: 97.7 F (36.5 C)  TempSrc: Oral  SpO2: 93%  Weight: 217 lb 8 oz (98.7 kg)  Height:  (1.753 m)   Body mass index is 32.12 kg/m.  General- elderly female, Obese, in no acute distress Head- normocephalic, atraumatic Nose- no nasal discharge Throat- moist mucus membrane, normal oropharynx, has dentures Eyes- PERRLA, EOMI, no pallor, no icterus, no discharge, normal conjunctiva, normal sclera Neck- no cervical lymphadenopathy Cardiovascular- normal s1,s2, no murmur Respiratory- bilateral clear to auscultation, no wheeze, no rhonchi, no crackles, no use of accessory muscles Abdomen- bowel sounds present, soft, non tender, no guarding or rigidity Musculoskeletal- able to move all 4 extremities, generalized weakness, trace leg edema Neurological- alert and oriented to person and place but not to time Skin- warm and dry Psychiatry- normal mood and  affect    Labs reviewed: Basic Metabolic Panel:  Recent Labs  16/10/96 2058 10/19/16 2234 10/21/16 0806  NA 137 137 139  K 4.7 4.0 4.3  CL 105 104 106  CO2 GLUCOSE 75 100* 115*  BUN CREATININE 1.02* 1.23* 0.93  CALCIUM 8.8* 8.7* 8.3*   Liver Function Tests:  Recent Labs  05/30/16 2324 08/22/16 0020 10/19/16 2234  AST ALT 20 14 10*  ALKPHOS 74 68 72  BILITOT 0.5 0.5 0.5  PROT 7.0 7.3 6.5  ALBUMIN 3.4* 3.5 2.7*    Recent Labs  10/19/16 2234  LIPASE 12   No results for input(s): AMMONIA in the last 8760 hours. CBC:  Recent Labs  08/22/16 0020 08/31/16 2058 10/19/16 2234 10/21/16 0806  WBC 9.5 10.5 11.1* 10.7*  NEUTROABS 7.3 8.2* 8.4*  --   HGB 12.9 12.7 11.7* 10.5*  HCT 38.4 38.4 35.1* 32.7*  MCV 89.3 89.1 86.9 87.2  PLT 181 178 273 260   Cardiac Enzymes: No results for input(s):  CKTOTAL, CKMB, CKMBINDEX, TROPONINI in the last 8760 hours. BNP: Invalid input(s): POCBNP CBG:  Recent Labs  10/20/16 1630 10/21/16 0623 10/21/16 1607  GLUCAP 117* 103* 112*    Radiological Exams: Ct Head Wo Contrast  Result Date: 10/20/2016 CLINICAL DATA:  Acute onset of altered mental status. Lethargy. Initial encounter. EXAM: CT HEAD WITHOUT CONTRAST TECHNIQUE: Contiguous axial images were obtained from the base of the skull through the vertex without intravenous contrast. COMPARISON:  CT of the head performed 08/22/2016 FINDINGS: Brain: No evidence of acute infarction, hemorrhage, hydrocephalus, extra-axial collection or mass lesion/mass effect. Prominence of the ventricles and sulci reflects mild to moderate cortical volume loss. Mild cerebellar atrophy is noted. Scattered periventricular white matter change likely reflects small vessel ischemic microangiopathy. The brainstem and fourth ventricle are within normal limits. The basal ganglia are unremarkable in appearance. The cerebral hemispheres demonstrate grossly normal gray-white  differentiation. No mass effect or midline shift is seen. Vascular: No hyperdense vessel or unexpected calcification. Skull: There is no evidence of fracture; visualized osseous structures are unremarkable in appearance. Sinuses/Orbits: The orbits are within normal limits. The paranasal sinuses and mastoid air cells are well-aerated. Other: No significant soft tissue abnormalities are seen. IMPRESSION: 1. No acute intracranial pathology seen on CT. 2. Mild to moderate cortical volume loss and scattered small vessel ischemic microangiopathy. Electronically Signed   By: Roanna Raider M.D.   On: 10/20/2016 00:45    Assessment/Plan  Generalized weakness From deconditioning. Will have her work with physical therapy and occupational therapy team to help with gait training and muscle strengthening exercises.fall precautions. Skin care. Encourage to be out of bed.   UTI Continue and complete course of Keflex 500 mg every 12 hours on 10/26/2016. Maintain hydration and perineal hygiene. Monitor clinically.  Leukocytosis Currently being treated for urinary infection. Monitor WBC and temperature curve.  Sacral pressure ulcer Provide wound care with pressure ulcer prophylaxis, gel overlay mattress and gel cushion for wheelchair to promote wound healing  Anemia of chronic disease Monitor CBC periodically  Parkinson's disease Continue carbidopa levodopa 25-100 milligrams 3 times a day with nuplazid daily and monitor. Provide supportive care.  lewy body Dementia with behavior disturbance Provide supportive care. Fall precautions and pressure ulcer prophylaxis to be provided. Continue donepezil 10 mg daily. Continue sertraline 50 mg daily and monitor. Psych consult.   Hypertension Monitor blood pressure reading, continue losartan 25 mg daily    Goals of care: short term rehabilitation   Labs/tests ordered: cbc, cmp 10/28/16  Family/ staff Communication: reviewed care plan with patient and nursing  supervisor    Oneal Grout, MD Internal Medicine [facility] [address] [place], [place] [address] Cell Phone (Monday-Friday 8 am - 5 pm): [phone] On Call: [phone] and follow prompts after 5 pm and on weekends Office Phone: [phone] Office Fax: [phone]

## 2016-10-27 ENCOUNTER — Telehealth: Payer: Self-pay | Admitting: Neurology

## 2016-10-27 NOTE — Telephone Encounter (Signed)
Pt husband called to inform the pt is at [facility] since 4-13 and will likely be there for a couple of weeks after being in the hospital for a few days. Pt husband said she had been comatose for a while then it was decided she needed to go to the rehab facility. Pt husband can be reached at [phone] if there are questions.

## 2016-10-28 LAB — BASIC METABOLIC PANEL
BUN: 21 mg/dL (ref 4–21)
CREATININE: 1.1 mg/dL (ref 0.5–1.1)
Glucose: 136 mg/dL
Potassium: 3.9 mmol/L (ref 3.4–5.3)
Sodium: 141 mmol/L (ref 137–147)

## 2016-10-28 LAB — CBC AND DIFFERENTIAL
HCT: 34 % — AB (ref 36–46)
Hemoglobin: 11.5 g/dL — AB (ref 12.0–16.0)
Neutrophils Absolute: 7 /uL
PLATELETS: 313 10*3/uL (ref 150–399)
WBC: 9.8 10^3/mL

## 2016-10-28 LAB — HEPATIC FUNCTION PANEL
ALK PHOS: 68 U/L (ref 25–125)
ALT: 14 U/L (ref 7–35)
AST: 14 U/L (ref 13–35)
Bilirubin, Total: 0.4 mg/dL

## 2016-11-03 LAB — HEMOGLOBIN A1C: Hemoglobin A1C: 5.9

## 2016-11-11 ENCOUNTER — Telehealth: Payer: Self-pay | Admitting: Neurology

## 2016-11-11 NOTE — Telephone Encounter (Signed)
Pt husband called stating he is pt's POA and he is requesting a letter of competency.  Please contact him on mobile#

## 2016-11-12 NOTE — Telephone Encounter (Addendum)
Left message for a return call.  Need to gather more information about what is needed.

## 2016-11-15 NOTE — Telephone Encounter (Signed)
Spoke to patient's husband - states she has rapidly declined and [facility]. Says she is much worse than last seen here in March 2018.  States she is not able to come into our office for further appts due to her worsening mobility and he has asked the attending MD at the facility to take over her prescriptions.  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

## 2016-11-22 ENCOUNTER — Non-Acute Institutional Stay (SKILLED_NURSING_FACILITY): Payer: Medicare Other | Admitting: Adult Health

## 2016-11-22 ENCOUNTER — Encounter: Payer: Self-pay | Admitting: Adult Health

## 2016-11-22 DIAGNOSIS — L89159 Pressure ulcer of sacral region, unspecified stage: Secondary | ICD-10-CM | POA: Diagnosis not present

## 2016-11-22 DIAGNOSIS — N390 Urinary tract infection, site not specified: Secondary | ICD-10-CM

## 2016-11-22 DIAGNOSIS — G20A1 Parkinson's disease without dyskinesia, without mention of fluctuations: Secondary | ICD-10-CM

## 2016-11-22 DIAGNOSIS — F339 Major depressive disorder, recurrent, unspecified: Secondary | ICD-10-CM

## 2016-11-22 DIAGNOSIS — G3183 Dementia with Lewy bodies: Secondary | ICD-10-CM | POA: Diagnosis not present

## 2016-11-22 DIAGNOSIS — G20 Parkinson's disease: Secondary | ICD-10-CM

## 2016-11-22 DIAGNOSIS — F0281 Dementia in other diseases classified elsewhere with behavioral disturbance: Secondary | ICD-10-CM

## 2016-11-22 DIAGNOSIS — R531 Weakness: Secondary | ICD-10-CM | POA: Diagnosis not present

## 2016-11-22 DIAGNOSIS — G20C Parkinsonism, unspecified: Secondary | ICD-10-CM

## 2016-11-22 DIAGNOSIS — I10 Essential (primary) hypertension: Secondary | ICD-10-CM

## 2016-11-22 NOTE — Progress Notes (Signed)
DATE:  11/22/2016   MRN:  [id number]  BIRTHDAY: December 25, 1934  Facility:  Nursing Home Location:  [facility]  Nursing Home Room Number: 605-P  LEVEL OF CARE:  SNF (31)  Contact Information    Name Relation Home Work Bethany Son [phone]  [phone]   Baby, Gieger   [phone]   Kerline, Trahan [phone]     Nelson,Charles Significant other [phone]     Kennedie, Pardoe   [phone]       Code Status History    Date Active Date Inactive Code Status Order ID Comments User Context   10/21/2016 11:43 AM 10/22/2016  6:25 PM DNR [id number]  Canary Brim, NP Inpatient   10/20/2016  3:25 AM 10/21/2016 11:43 AM Full Code [id number]  Alberteen Sam, MD ED   08/31/2016 11:32 PM 09/01/2016  4:22 PM Full Code [id number]  Bobette Mo, MD ED   05/14/2016  2:26 AM 05/14/2016  4:11 PM Full Code [id number]  Lona Kettle, PA-C ED   10/12/2013  3:22 PM 10/15/2013  8:35 PM Full Code [id number]  Abelino Derrick, PA-C ED    Questions for Most Recent Historical Code Status (Order [id number])    Question Answer Comment   In the event of cardiac or respiratory ARREST Do not call a "code blue"    In the event of cardiac or respiratory ARREST Do not perform Intubation, CPR, defibrillation or ACLS    In the event of cardiac or respiratory ARREST Use medication by any route, position, wound care, and other measures to relive pain and suffering. May use oxygen, suction and manual treatment of airway obstruction as needed for comfort.        Chief Complaint  Patient presents with  . Medical Management of Chronic Issues    HISTORY OF PRESENT ILLNESS:  This is a 81-YO female seen for a routine visit.  She is a short-term rehabilitation resident of [facility].She was recently started on Seroquel 25 mg daily at bedtime for Parkinson psychosis.   She has been admitted to [facility] health on 10/22/16 from [facility]  post admission date 10/19/16 through 10/21/16 with altered mental status which is thought to be from UTI and dementia. She was started on antibiotic. She has PMH of Parkinson's disease, dementia, hypertension and coronary artery disease.  PAST MEDICAL HISTORY:  Past Medical History:  Diagnosis Date  . Anxiety   . Arthritis   . Diabetes mellitus without complication (HCC)   . Irregular heart rhythm   . Macular degeneration   . Memory loss      CURRENT MEDICATIONS: Reviewed  Patient's Medications  New Prescriptions   No medications on file  Previous Medications   AMINO ACIDS-PROTEIN HYDROLYS (FEEDING SUPPLEMENT, PRO-STAT SUGAR FREE 64,) LIQD    Take 30 mLs by mouth daily.   CARBIDOPA-LEVODOPA (SINEMET IR) 25-100 MG TABLET    Take 1 tablet by mouth 3 (three) times daily.   CHLORPHENIRAMINE MALEATE (ALLERGY RELIEF PO)    Take 1 tablet by mouth every evening.   DONEPEZIL (ARICEPT) 5 MG TABLET    Take 5 mg by mouth at bedtime.   LOSARTAN (COZAAR) 25 MG TABLET    Take 25 mg by mouth daily.    MULTIPLE VITAMINS-MINERALS (DECUBI-VITE PO)    Take 1 tablet by mouth daily.   NUPLAZID 17 MG TABS    Take 34 mg by mouth every evening. Take 2 tablets to =  34 mg QPM   NUTRITIONAL SUPPLEMENT LIQD    Take 120 mLs by mouth daily. NSA MedPass   QUETIAPINE (SEROQUEL) 25 MG TABLET    Take 25 mg by mouth at bedtime.   SERTRALINE (ZOLOFT) 50 MG TABLET    Take 50 mg by mouth daily.   Modified Medications   No medications on file  Discontinued Medications   DONEPEZIL (ARICEPT) 10 MG TABLET    Take 10 mg by mouth at bedtime.     Allergies  Allergen Reactions  . Erythromycin Other (See Comments)    Heart flutter  . Sulfonamide Derivatives Diarrhea    diarrhea  . Flu Virus Vaccine   . Latex Rash  . Neosporin [Neomycin-Bacitracin Zn-Polymyx] Rash  . Penicillins Other (See Comments)    Patient has no idea what her reaction is   . Polysporin [Bacitracin-Polymyxin B] Rash  . Red Dye Other (See Comments)      REVIEW OF SYSTEMS:  GENERAL: no change in appetite, no fatigue, no weight changes, no fever, chills or weakness EYES: Denies change in vision, dry eyes, eye pain, itching or discharge EARS: Denies change in hearing, ringing in ears, or earache NOSE: Denies nasal congestion or epistaxis MOUTH and THROAT: Denies oral discomfort, gingival pain or bleeding, pain from teeth or hoarseness   RESPIRATORY: no cough, SOB, DOE, wheezing, hemoptysis CARDIAC: no chest pain, edema or palpitations GI: no abdominal pain, diarrhea, constipation, heart burn, nausea or vomiting GU: Denies dysuria, frequency, hematuria, incontinence, or discharge PSYCHIATRIC: Denies feeling of depression or anxiety. No report of hallucinations, insomnia, paranoia, or agitation   PHYSICAL EXAMINATION  GENERAL APPEARANCE: Well nourished. In no acute distress.  SKIN:  Sacrum pressure ulcer, unstageable HEAD: Normal in size and contour. No evidence of trauma EYES: Lids open and close normally. No blepharitis, entropion or ectropion. PERRL. Conjunctivae are clear and sclerae are white. Lenses are without opacity EARS: Pinnae are normal. Patient hears normal voice tunes of the examiner MOUTH and THROAT: Lips are without lesions. Oral mucosa is moist and without lesions. Tongue is normal in shape, size, and color and without lesions NECK: supple, trachea midline, no neck masses, no thyroid tenderness, no thyromegaly LYMPHATICS: no LAN in the neck, no supraclavicular LAN RESPIRATORY: breathing is even & unlabored, BS CTAB CARDIAC: RRR, no murmur,no extra heart sounds, no edema GI: abdomen soft, normal BS, no masses, no tenderness, no hepatomegaly, no splenomegaly EXTREMITIES:  Able to move 4 extremities NEURO:  Sleepy PSYCHIATRIC: Alert to self, disoriented to time and place. Affect and behavior are appropriate   LABS/RADIOLOGY: Labs reviewed: Basic Metabolic Panel:  Recent Labs  40/98/1102/20/18 2058 10/19/16 2234  10/21/16 0806 10/28/16  NA 137 137 139 141  K 4.7 4.0 4.3 3.9  CL 105 104 106  --   CO2 25 25 23   --   GLUCOSE 75 100* 115*  --   BUN 20 15 10 21   CREATININE 1.02* 1.23* 0.93 1.1  CALCIUM 8.8* 8.7* 8.3*  --    Liver Function Tests:  Recent Labs  05/30/16 2324 08/22/16 0020 10/19/16 2234 10/28/16  AST 21 17 17 14   ALT 20 14 10* 14  ALKPHOS 74 68 72 68  BILITOT 0.5 0.5 0.5  --   PROT 7.0 7.3 6.5  --   ALBUMIN 3.4* 3.5 2.7*  --     Recent Labs  10/19/16 2234  LIPASE 12   CBC:  Recent Labs  08/31/16 2058 10/19/16 2234 10/21/16 0806 10/28/16  WBC  10.5 11.1* 10.7* 9.8  NEUTROABS 8.2* 8.4*  --  7  HGB 12.7 11.7* 10.5* 11.5*  HCT 38.4 35.1* 32.7* 34*  MCV 89.1 86.9 87.2  --   PLT 178 273 260 313   CBG:  Recent Labs  10/20/16 1630 10/21/16 0623 10/21/16 1607  GLUCAP 117* 103* 112*     ASSESSMENT/PLAN:  Generalized weakness - continue rehabilitation with PT and OT, for therapeutic strengthening exercises; fall precautions  UTI - completed course of Keflex; UA w/CS  Sacral pressure ulcer, unstageable - continue wound treatment daily; keep skin clean and dry; Decubivite daily  Parkinson psychosis - continue Nuplazid 17 mg 2 tabs = 34 mg by mouth every evening, Seroquel 25 mg 1 tab by mouth daily at bedtime, followed by team health psych services  Major depression - Zoloft 50 mg 1 tab by mouth daily  Lewy body dementia with behavioral disturbance - continue Aricept 5 mg 1 tab by mouth daily at bedtime   Parkinson's disease - continue Sinemet 25-100 milligrams 1 tab by mouth 3 times a day  Benign hypertension - well controlled; continue Cozaar 25 mg 1 tab by mouth daily      Goals of care:  Short-term rehabilitation    Monina C. Medina-Vargas - NP    [employer] [phone]

## 2016-12-16 ENCOUNTER — Telehealth: Payer: Self-pay | Admitting: Neurology

## 2016-12-16 NOTE — Telephone Encounter (Signed)
Left message for a return call

## 2016-12-16 NOTE — Telephone Encounter (Signed)
[name] called requesting to speak with Lauren DusterMichelle RN regarding some changes the patient has been experiencing. Please call and advise.

## 2016-12-16 NOTE — Telephone Encounter (Signed)
Attempted to reach Skip MayerEddie Digilio again.  Left another message for a return call.

## 2016-12-17 NOTE — Telephone Encounter (Signed)
Rn call patients husband about his wife. Pt is currently at [facility]. He states patients memory is getting worse. She is having hallucinations. Also her ambulatory status has decrease. He reported his wife has pressure ulcers on her heels,and therapy had to be stop for a while. Rn ask patient if the nursing home knows she Is having hallucinations, and increase memory problems. Rn stated to husband no calls have been made from the facility to our office. Pts husband he has notice them. Pts husband wanted a sooner appt with Dr.Yan. Rn stated Dr.Yan has no availability.because she will be on vacation. Rn r/s a sooner appt with the NP. Rn stated to [facility] to [facility] or [facility] of new appt in June 2018. Pts husband verbalized understanding.

## 2016-12-17 NOTE — Telephone Encounter (Signed)
[name] returned the call and was hoping to speak with Nadeen LandauMichelle RN. Please call and advise. Please call [phone].

## 2016-12-28 ENCOUNTER — Telehealth: Payer: Self-pay | Admitting: Neurology

## 2016-12-28 NOTE — Telephone Encounter (Signed)
Spoke to patient's husband - he is requesting his wife be seen sooner due to her worsening behavior/confusion.  She had a pending appt w/ Eber Jonesarolyn on 01/04/17 but this has been moved to an available time on 12/29/16.  I have called [facility] (spoke to Lexingtonracy) who will arrange for transportation to have her here at 9:45am for her 10:15am appt.

## 2016-12-28 NOTE — Telephone Encounter (Signed)
Pt husband would like to speak with you re: the appointment and the behavior he is seeing in pt, wants to know if appointment should be kept or wait until Dr Terrace ArabiaYan returns, please call

## 2016-12-28 NOTE — Telephone Encounter (Signed)
Left message for a return call

## 2016-12-29 ENCOUNTER — Encounter: Payer: Self-pay | Admitting: Nurse Practitioner

## 2016-12-29 ENCOUNTER — Ambulatory Visit (INDEPENDENT_AMBULATORY_CARE_PROVIDER_SITE_OTHER): Payer: Medicare Other | Admitting: Nurse Practitioner

## 2016-12-29 VITALS — BP 86/54 | HR 96 | Ht 69.0 in

## 2016-12-29 DIAGNOSIS — R443 Hallucinations, unspecified: Secondary | ICD-10-CM

## 2016-12-29 DIAGNOSIS — G3183 Dementia with Lewy bodies: Secondary | ICD-10-CM

## 2016-12-29 DIAGNOSIS — F0281 Dementia in other diseases classified elsewhere with behavioral disturbance: Secondary | ICD-10-CM

## 2016-12-29 DIAGNOSIS — R4182 Altered mental status, unspecified: Secondary | ICD-10-CM

## 2016-12-29 DIAGNOSIS — R269 Unspecified abnormalities of gait and mobility: Secondary | ICD-10-CM | POA: Diagnosis not present

## 2016-12-29 NOTE — Progress Notes (Signed)
[facility]  PATIENT: Lauren Lauren Thomas DOB: 1934/08/26   REASON FOR VISIT: Follow-up for memory loss worsening behavior confusion and hallucinations HISTORY FROM: Patient and husband    HISTORY OF PRESENT ILLNESS: HISTORY 04/27/16: Lauren Lauren Thomas is a 82 years old right-handed female, accompanied by her husband Lauren Lauren Thomas her house mate Lauren Lauren Thomas, seen in refer by her primary care physician Dr. Chesley Thomas onJan 30 2017 for evaluation of memory loss.  She had history of insulin dependent diabetes since 1997, macular degeneration, with poor vision, she could no longer read newpaer, could not tell features of face.  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  She has no family history of dementia, she denies visual hallucinations  UPDATE Oct 17th 2017: Reviewed laboratory evaluation in 2017, low normal B12 222, normal CBC, ESR 20, thyroid panel, negative RPR,  We have personally reviewed MRI brain on Apr 23 2016: mild atrophy, supratentorium disease.  She complains of visual hallucinations, people at the backyard for yard sale, she woke up few times at night, she could not read any longer, her vision is bad, she could not watch TV, "I have to invent for something to be at corner". She is mentally busy, callingpeople.   UPDATE Jan 16th 2018: She came in with her extended family, 2 sons, and her ex-husband, her house mate,  She continue have mild memory trouble, gait  abnormality, the most disturbing symptoms are her increased visual hallucinations, she saw people sitting at her porch, sometimes she trying to close the door to avoid people getting to her house.  Today's Mini-Mental status 26/27, because of poor vision,  We have personally reviewed MRI of the brain November 2017, no acute abnormality, generalized atrophy  UPDATE September 30 2016: She is here to follow-up her central nervous system degenerative disorder, memory loss was parkinsonian features, possible Lewy body dementia, significant visual hallucinations, confusion spells  She was giving a trial of Sinemet 25/100 mg 3 times a day, but due to increased confusion agitations, it was stopped in early 2018, it did not help her mobility.  She was also treated with nuplazid for few month at the end of 2017, was no significant improvement of her agitation.  She was started on seroquel titrating dose, since January 2018 for worsening visual hallucination, sometimes agitations, she is also taking Abilify 2 mg every morning, Zoloft 50 mg every day. She spent most of the time sitting the chair, UPDATE 06/20/2018CM Lauren Lauren Thomas, 82 year old female returns for follow-up with her husband. She has history of memory loss and confusion and hallucinations. Her features are questionable Lewy body dementia. She was treated with Nuplazid without benefit . She was started on Seroquel at her last visit  dose has been increased to 25 mg twice daily according to her MAR no documentation in the record that Dr. Krista Thomas increased the dose on 12/22/16. She spends most of her time in the chair. She was seen in the emergency roomand spent 2 days in the hospital  in April for altered mental status, dehydration Today  she is lethargic but easy to arouse. She returns for reevaluation  REVIEW OF SYSTEMS: Full 14 system review of systems performed and notable only for those listed, all others are neg:  Constitutional: neg    Cardiovascular: neg Ear/Nose/Throat: neg  Skin: neg Eyes: Macular degeneration Respiratory : neg Gastroitestinal: urinary incontinence with depends Hematalogy/Lymphatic: neg  Endocrine: neg Musculoskeletal:neg Allergy/Immunology: neg Neurological: Memory loss Psychiatric : Hallucinations and confusion Sleep : neg   ALLERGIES: Allergies  Allergen Reactions  . Erythromycin Other (See Comments)    Heart flutter  . Sulfonamide Derivatives Diarrhea    diarrhea  . Ciprofloxacin   . Flu Virus Vaccine   . Latex Rash  . Neosporin [Neomycin-Bacitracin Zn-Polymyx] Rash  . Penicillins Other (See Comments)    Patient has no idea what her reaction is   . Polysporin [Bacitracin-Polymyxin B] Rash  . Red Dye Other (See Comments)    HOME MEDICATIONS: Outpatient Medications Prior to Visit  Medication Sig Dispense Refill  . Amino Acids-Protein Hydrolys (FEEDING SUPPLEMENT, PRO-STAT SUGAR FREE 64,) LIQD Take 30 mLs by mouth daily.    . carbidopa-levodopa (SINEMET IR) 25-100 MG tablet Take 1 tablet by mouth 3 (three) times daily.    . Chlorpheniramine Maleate (ALLERGY RELIEF PO) Take 1 tablet by mouth every evening.    . donepezil (ARICEPT) 5 MG tablet Take 5 mg by mouth at bedtime.    Marland Kitchen losartan (COZAAR) 25 MG tablet Take 25 mg by mouth daily.     . Multiple Vitamins-Minerals (DECUBI-VITE PO) Take 1 tablet by mouth daily.    Marland Kitchen NUTRITIONAL SUPPLEMENT LIQD Take 120 mLs by mouth daily. NSA MedPass    . QUEtiapine (SEROQUEL) 25 MG tablet Take 25 mg by mouth at bedtime.    . sertraline (ZOLOFT) 50 MG tablet Take 50 mg by mouth daily.     . NUPLAZID 17 MG TABS Take 34 mg by mouth every evening. Take 2 tablets to = 34 mg QPM     No facility-administered medications prior to visit.     PAST MEDICAL HISTORY: Past Medical History:  Diagnosis Date  . Anxiety   . Arthritis   . Diabetes mellitus without complication ([place])   . Irregular heart rhythm   . Macular degeneration   . Memory loss      PAST SURGICAL HISTORY: Past Surgical History:  Procedure Laterality Date  . ABDOMINAL HYSTERECTOMY    . BACK SURGERY    . CATARACT EXTRACTION    . CHOLECYSTECTOMY    . LEFT HEART CATHETERIZATION WITH CORONARY ANGIOGRAM N/A 10/15/2013   Procedure: LEFT HEART CATHETERIZATION WITH CORONARY ANGIOGRAM;  Surgeon: Sinclair Grooms, MD;  Location[facility] CATH LAB;  Service: Cardiovascular;  Laterality: N/A;    FAMILY HISTORY: Family History  Problem Relation Age of Onset  . Heart attack Mother   . Heart disease Mother   . Diabetes Mother   . Prostate cancer Father   . Diabetes Maternal Grandmother   . Heart disease Brother   . Lung cancer Brother     SOCIAL HISTORY: Social History   Social History  . Marital status: Legally Separated    Spouse name: N/A  . Number of children: 3  . Years of education: BA   Occupational History  . Retired    Social History Main Topics  . Smoking status: Never Smoker  . Smokeless tobacco: Never Used  . Alcohol use No  . Drug use: No  .  Sexual activity: Not on file   Other Topics Concern  . Not on file   Social History Narrative   Lives at home with her friend, [name].   Right-handed.   2-3 cups caffeine daily.     PHYSICAL EXAM  Vitals:   12/29/16 0952  BP: (!) 86/54  Pulse: 96  Height: 5' 9"  (1.753 m)   There is no height or weight on file to calculate BMI.  Generalized: Well developed, in no acute distress  Head: normocephalic and atraumatic,. Oropharynx benign  Neck: Supple, no carotid bruits  Cardiac: Regular rate rhythm, no murmur  Musculoskeletal: No deformity   Neurological examination   Mentation: Lethargic but easy to arouse, slow to respond to questions Follows all commands speech and language fluent.   Cranial nerve II-XII: Pupils were equal round reactive to light extraocular movements were full, visual field were full on confrontational test. Facial sensation and strength were normal. hearing was  intact to finger rubbing bilaterally. Uvula tongue midline. head turning and shoulder shrug were normal and symmetric.Tongue protrusion into cheek strength was normal. Motor:  mild limb and nuchal rigidity, no significant weakness SensoryDecreased to light touch and pinprick  Coordination: finger-nose-finger,  no dysmetria ReflexesHypoactive and symmetricntar responses were flexor bilaterally. Gait and Station Not ambulated in wheelchair DIAGNOSTIC DATA (LABS, IMAGING, TESTING) - I reviewed patient records, labs, notes, testing and imaging myself where available.  Lab Results  Component Value Date   WBC 9.8 10/28/2016   HGB 11.5 (A) 10/28/2016   HCT 34 (A) 10/28/2016   MCV 87.2 10/21/2016   PLT 313 10/28/2016      Component Value Date/Time   NA 141 10/28/2016   K 3.9 10/28/2016   CL 106 10/21/2016 0806   CO2 23 10/21/2016 0806   GLUCOSE 115 (H) 10/21/2016 0806   BUN 21 10/28/2016   CREATININE 1.1 10/28/2016   CREATININE 0.93 10/21/2016 0806   CALCIUM 8.3 (L) 10/21/2016 0806   PROT 6.5 10/19/2016 2234   ALBUMIN 2.7 (L) 10/19/2016 2234   AST 14 10/28/2016   ALT 14 10/28/2016   ALKPHOS 68 10/28/2016   BILITOT 0.5 10/19/2016 2234   GFRNONAA 57 (L) 10/21/2016 0806   GFRAA >60 10/21/2016 0806    Lab Results  Component Value Date   HGBA1C 5.9 11/03/2016   Lab Results  Component Value Date   VITAMINB12 220 08/11/2015   Lab Results  Component Value Date   TSH 0.982 08/11/2015      ASSESSMENT AND PLAN  82 y.o. year old female  has a past medical history of Central nervous system degenerative disorder probable Lewy body dementia complicated by visual hallucinations.Nuplazid with limited response blood pressure in the office today 86/54. Patient's mucous membranes are dry she is probably dehydrated.The patient is a current patient of Dr. Krista Thomas  who is out of the office today . This note is sent to the work in doctor.     PLAN:  Continue Seroquel 25 mg twice daily this was  increased on 12/22/2016  Please make sure patient is well hydrated , her mucous membranes are dry  Follow-up in 6 months Call our office for further increases in Seroquel  I spent 25 min in total face to face time with the patient and husband more than 50% of which was spent counseling and coordination of care, reviewing test results reviewing medications and discussing and reviewing the diagnosis of memory loss and dehydration and the importance of making sure she has liquids  to drink at all times to prevent dehydration, increased fluid will also increase her blood pressure  Lauren Lauren Thomas, [facility], [facility], APRN  [facility] [address] [place], [place] [address] [phone]

## 2016-12-29 NOTE — Patient Instructions (Signed)
Per skilled sheet 

## 2017-01-04 ENCOUNTER — Ambulatory Visit: Payer: Self-pay | Admitting: Nurse Practitioner

## 2017-01-13 ENCOUNTER — Telehealth: Payer: Self-pay | Admitting: Neurology

## 2017-01-13 NOTE — Telephone Encounter (Signed)
Patient husband just called would like a call back from [place] because he stated his wife has taking a dramatic turn and doesn't know if he needs to bring her in. He did not give me any information on what is going on with the patient other than he would like a call back from the nurse. The best number to contact the patient is [phone]

## 2017-01-13 NOTE — Telephone Encounter (Signed)
Spoke to patient's husband - [facility] - having good and bad days.  States the changes have not been sudden but he meant dramatic since her initial diagnosis.  He wanted information on support groups.  I directed him to [email or web address] and also provided him with [place] # [phone]).

## 2017-01-22 NOTE — Progress Notes (Signed)
Personally  participated in, made any corrections needed, and agree with history, physical, neuro exam,assessment and plan as stated.     Kimm Sider, MD [facility]     

## 2017-02-03 ENCOUNTER — Other Ambulatory Visit (HOSPITAL_COMMUNITY): Payer: Self-pay | Admitting: Internal Medicine

## 2017-02-03 DIAGNOSIS — R1319 Other dysphagia: Secondary | ICD-10-CM

## 2017-02-09 ENCOUNTER — Ambulatory Visit (HOSPITAL_COMMUNITY)
Admission: RE | Admit: 2017-02-09 | Discharge: 2017-02-09 | Disposition: A | Discharge status: Home / Self Care | Payer: Medicare Other | Source: Ambulatory Visit | Attending: Internal Medicine | Admitting: Internal Medicine

## 2017-02-09 DIAGNOSIS — R1319 Other dysphagia: Secondary | ICD-10-CM | POA: Insufficient documentation

## 2017-02-09 NOTE — Progress Notes (Signed)
Modified Barium Swallow Progress Note  Patient Details  Name: Lauren BangsDorothy H Weyenberg MRN: [id number] Date of Birth: 18-Jan-1935  Today's Date: 02/09/2017  Modified Barium Swallow completed.  Full report located under Chart Review in the Imaging Section.  Brief recommendations include the following:  Clinical Impression  Pt demonstrates adequate oropharyngeal strength, primary deficits are in timing of swallow, impacted by oral control and rate/size of intake. Pt able to adequately masticate and orally manipulate boluses, but sometimes talks or consumes too rapidly leading to premature spillage or exacerbating delay in swallow initiation. With thin and nectar thick liquids, if pt consumes sips continuously or rapidly one after another without breaks she often has intermiteent penetration or aspriation before the swallow. Aspiration is sensed, but penetration is not. If cued to clear her throat, penetrates and aspirate are ejected. Pt also benefitted from cues to take one single sip at a time, resulting in functional swallows. A chin tuck was attempted, but ineffective. Suggest continuing a regular diet and thin liquids with SLP f/u for training with rate control, preventative throat clearing and possible use of an adaptive Provale cup. Would not suggest thickening at this time given only trace quantities of aspiration and no significant change with use of thickener as well as concern for dehydration.    Swallow Evaluation Recommendations       SLP Diet Recommendations: Regular solids;Thin liquid   Liquid Administration via: Cup;No straw   Medication Administration: Whole meds with puree   Supervision: Full supervision/cueing for compensatory strategies   Compensations: Slow rate;Small sips/bites;Minimize environmental distractions;Clear throat intermittently   Postural Changes: Seated upright at 90 degrees   Oral Care Recommendations: Oral care QID       Harlon DittyBonnie Feliza Diven, MA CCC-SLP  [phone]   Claudine MoutonDeBlois, Alys Dulak Caroline 02/09/2017,2:00 PM

## 2017-03-12 DEATH — deceased

## 2017-04-01 ENCOUNTER — Telehealth: Payer: Self-pay | Admitting: Neurology

## 2017-04-01 NOTE — Telephone Encounter (Signed)
Left message letting Lauren Thomas know how sorry we are to hear this news.

## 2017-04-01 NOTE — Telephone Encounter (Signed)
Pt's husband called in to let the clinic know she passed 2017/04/10. He also wanted Dr Terrace Arabia and Marcelino Duster to know how much they appreciate all the great care she was given.

## 2017-04-06 ENCOUNTER — Ambulatory Visit: Payer: Medicare Other | Admitting: Nurse Practitioner

## 2017-06-30 ENCOUNTER — Ambulatory Visit: Payer: Medicare Other | Admitting: Nurse Practitioner

## 2017-12-01 IMAGING — CR DG CHEST 2V
2 series · 2 of 2 positions shown · non-contrast
Comparison: Chest radiograph August 22, 2016

CLINICAL DATA: Hypoglycemia at home.  History of diabetes.

EXAM:
CHEST  2 VIEW

[chest lat]
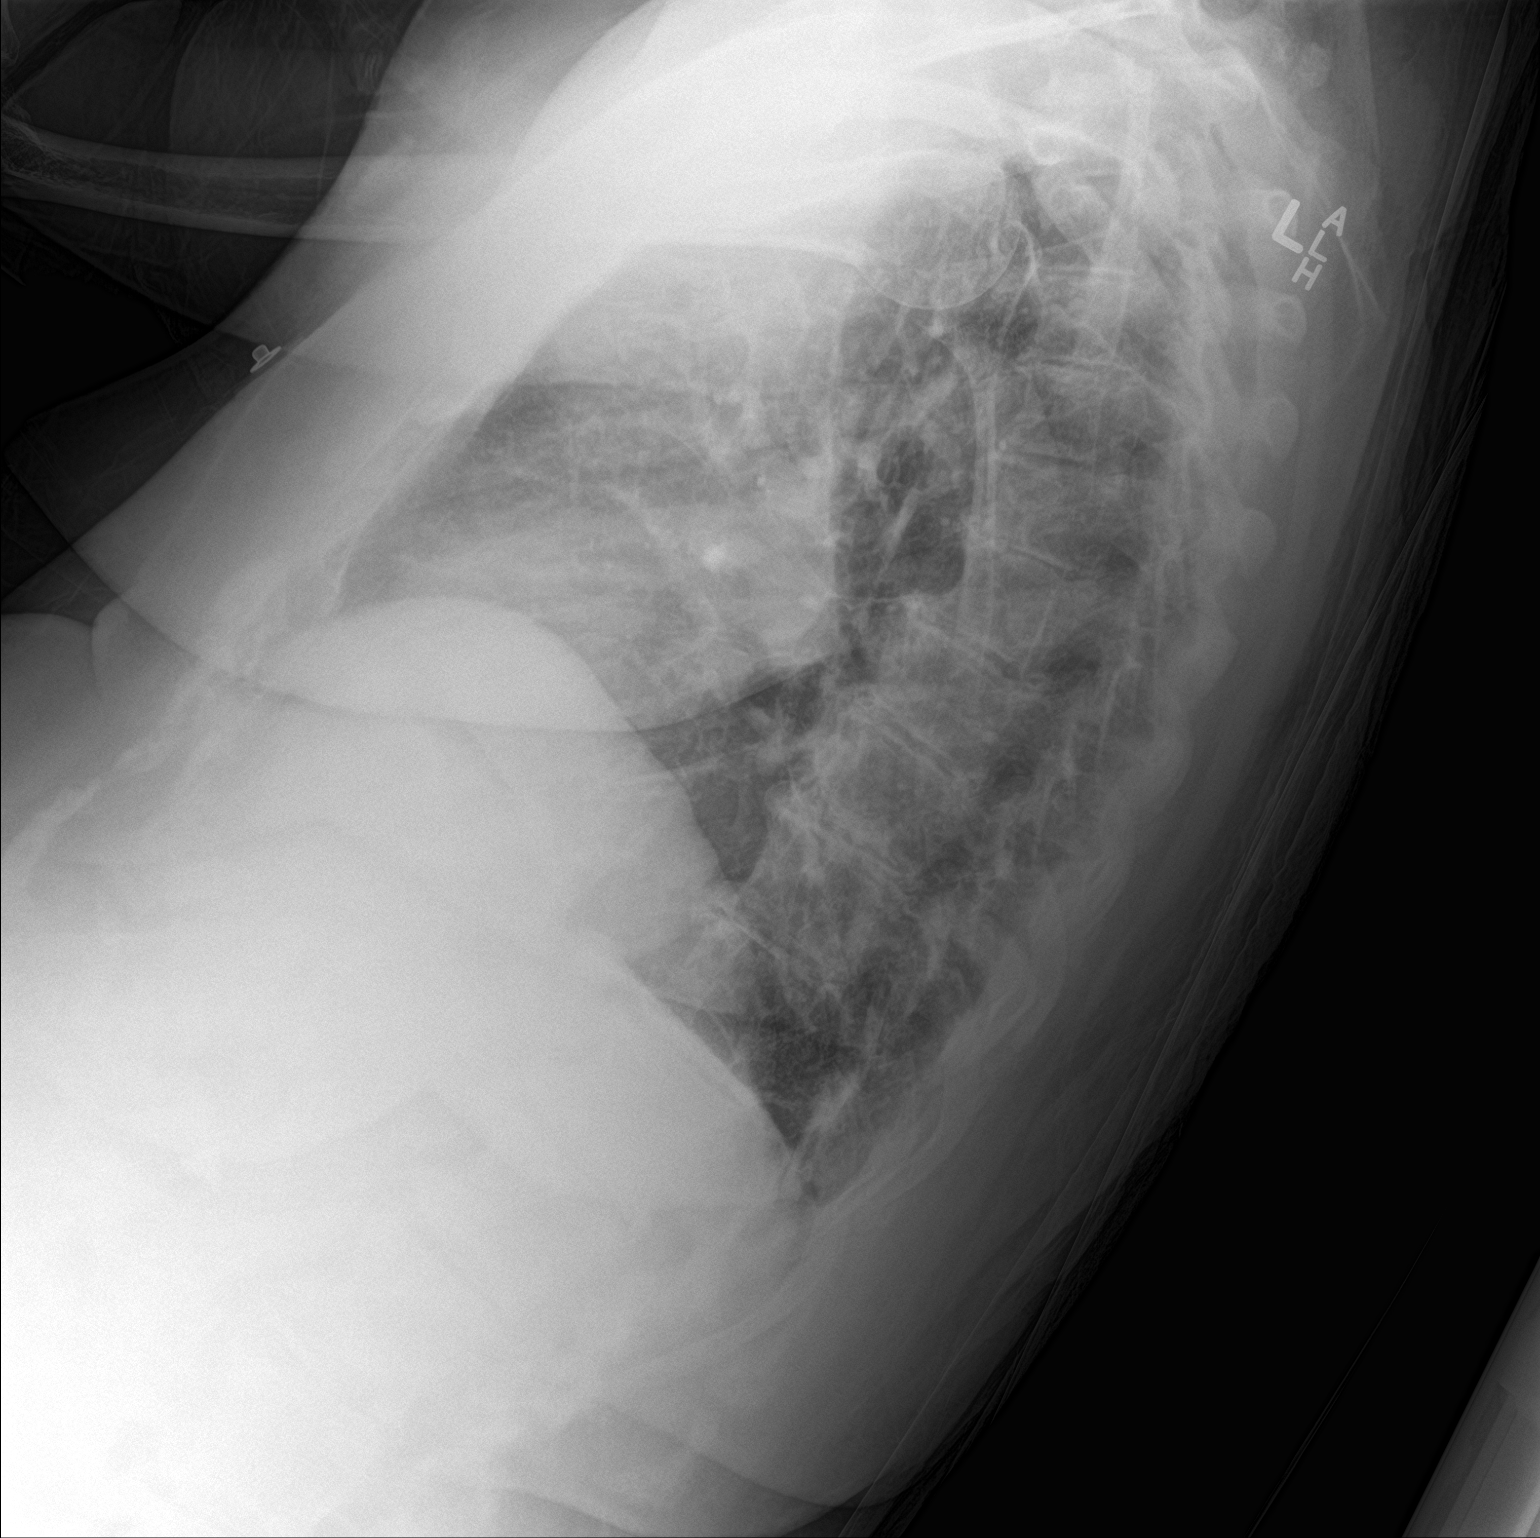

[chest ap]
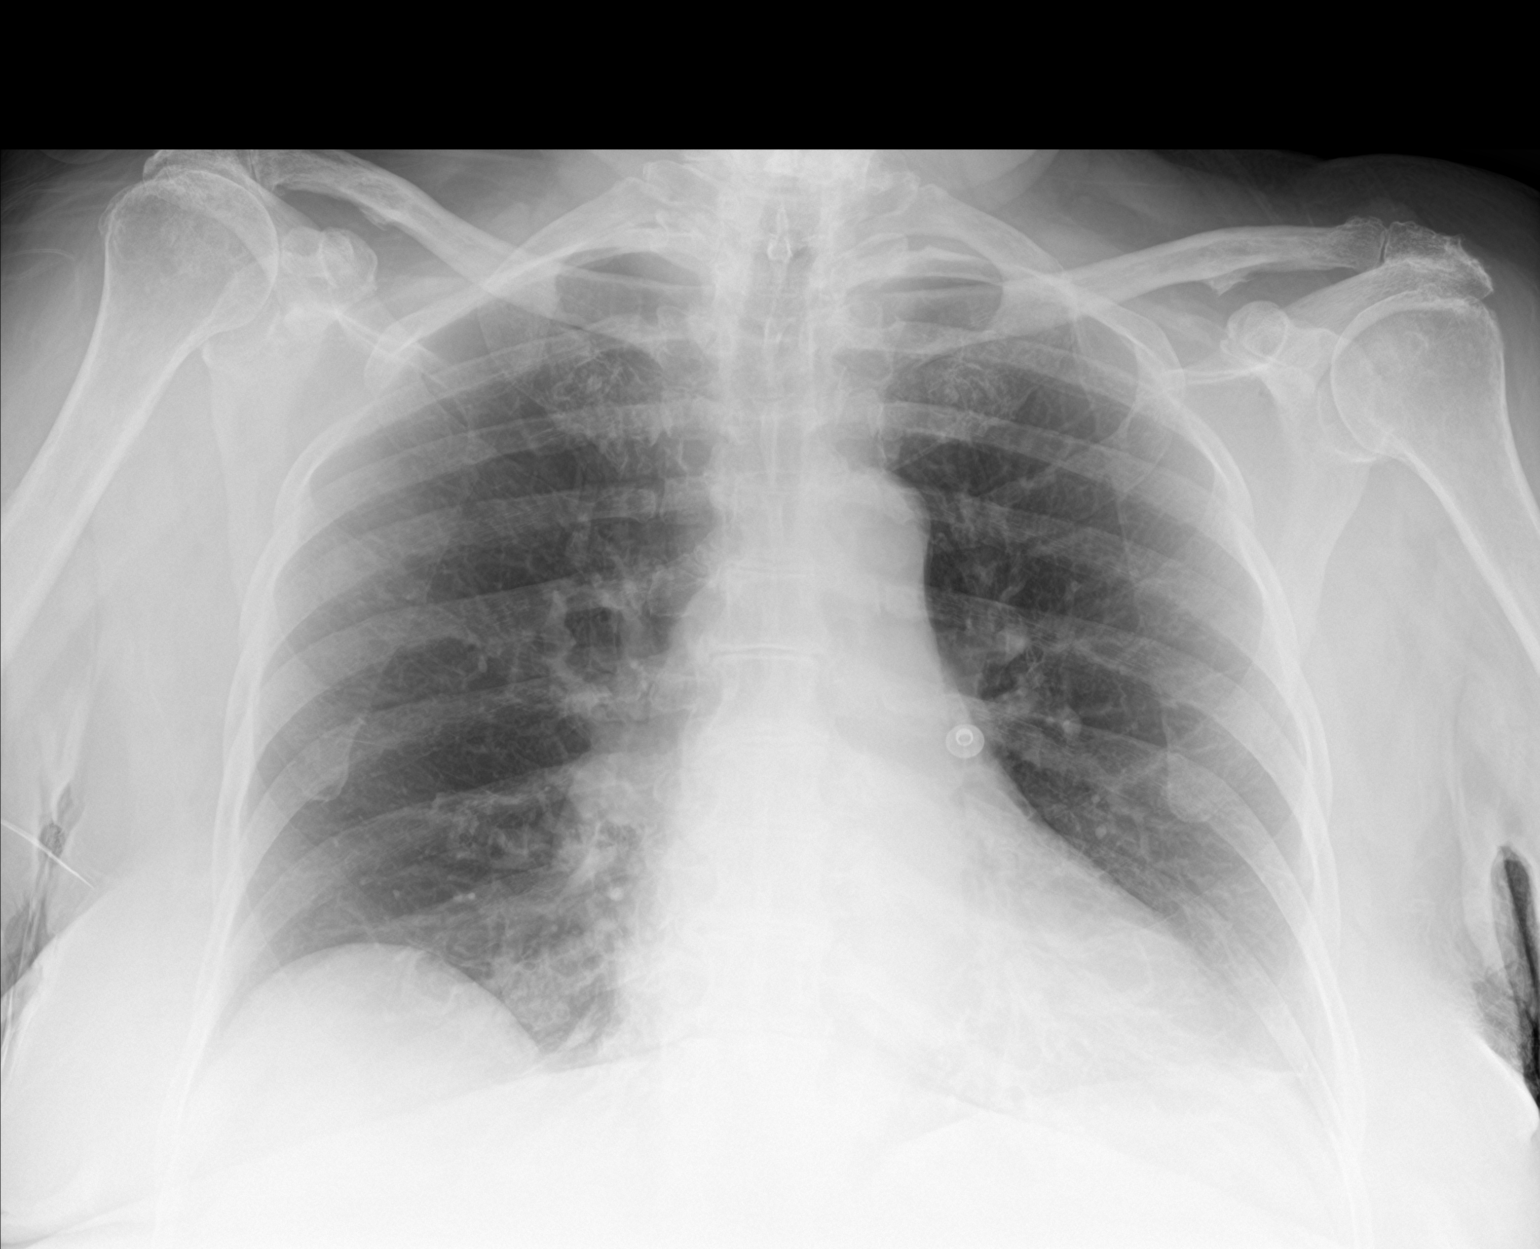

[2 of 2 positions shown; findings below may reference images not displayed]

FINDINGS: The cardiac silhouette is upper limits of normal in size, unchanged.
Mediastinal silhouette is nonsuspicious. Bibasilar strandy densities
without pleural effusion or focal consolidation. No pneumothorax.
High-riding RIGHT humeral head compatible with old rotator cuff
injury. Moderate degenerative changes shoulders. Moderate
degenerative change of the thoracic spine.
IMPRESSION: Borderline cardiomegaly.  Bibasilar atelectasis.

## 2018-01-20 IMAGING — CT CT HEAD W/O CM
3 series · 15 of 47 positions shown, 18 images · non-contrast
Comparison: CT of the head performed 08/22/2016

CLINICAL DATA: Acute onset of altered mental status. Lethargy.
Initial encounter.

EXAM:
CT HEAD WITHOUT CONTRAST
TECHNIQUE: Contiguous axial images were obtained from the base of the skull
through the vertex without intravenous contrast.

[Series 3: head 5.0 h30s · axial · 0.42mm/px · z∈[-2,+133]mm · 9 of 33 slices shown, 12 images]
[im 3/33  brain]
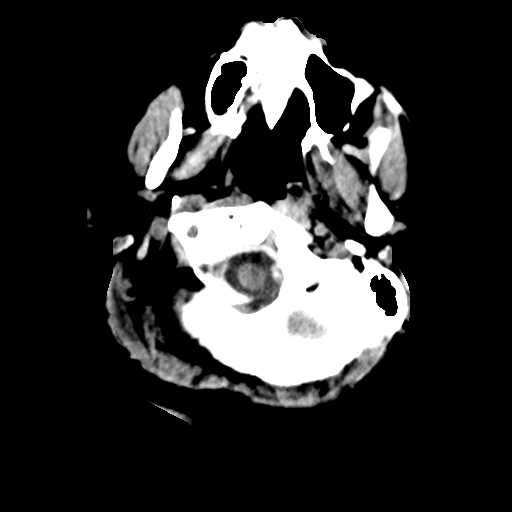
[im 3/33  bone]
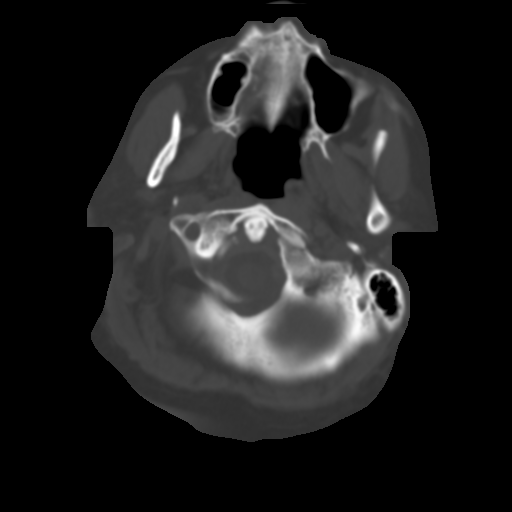
[im 6/33  brain]
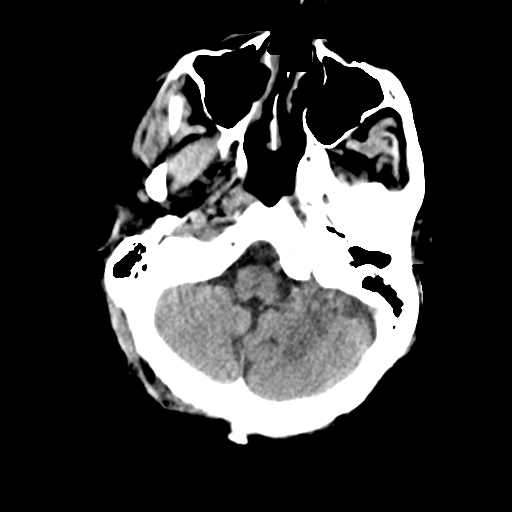
[im 9/33  brain]
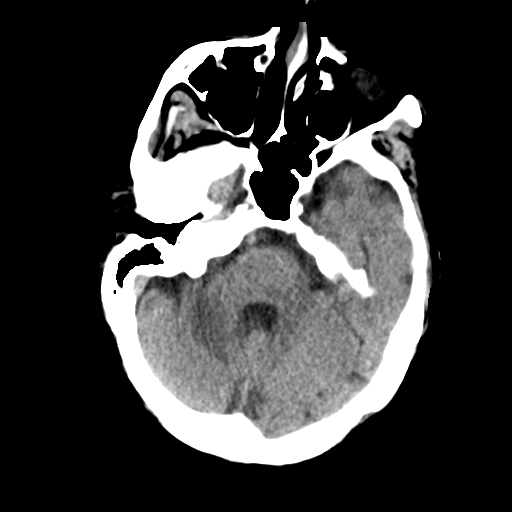
[im 13/33  brain]
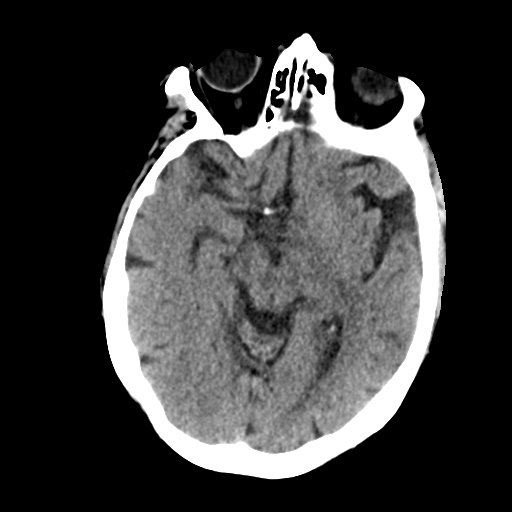
[im 17/33  brain]
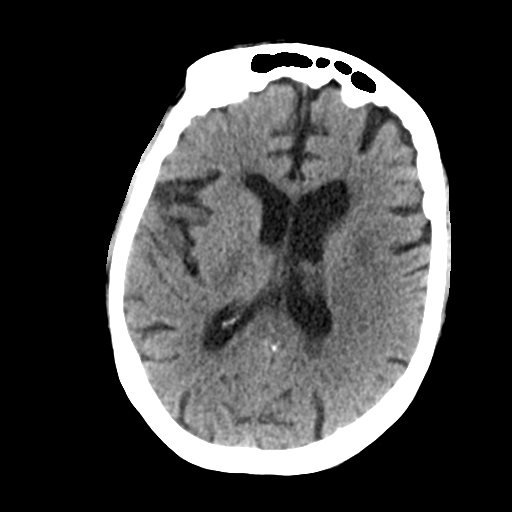
[im 17/33  bone]
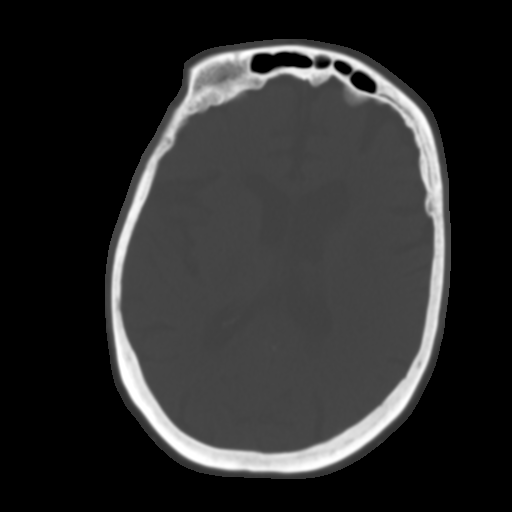
[im 20/33  brain]
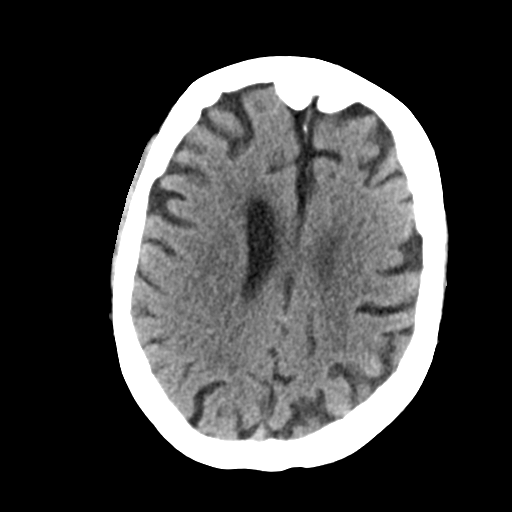
[im 24/33  brain]
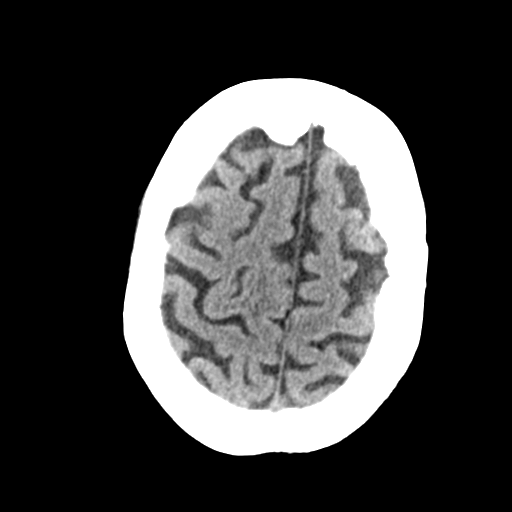
[im 27/33  brain]
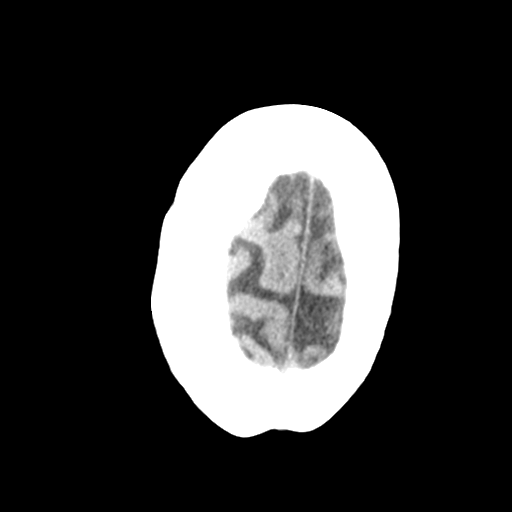
[im 30/33  brain]
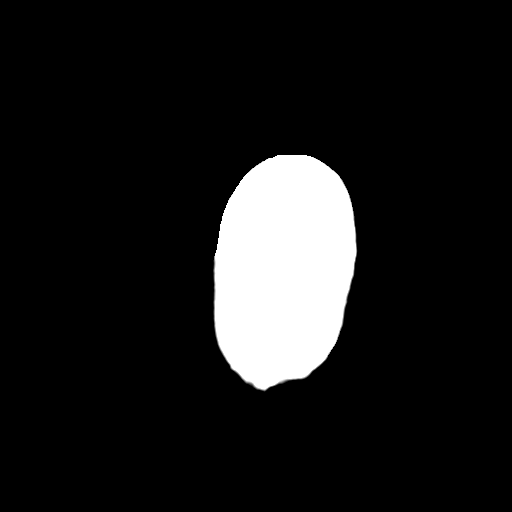
[im 30/33  bone]
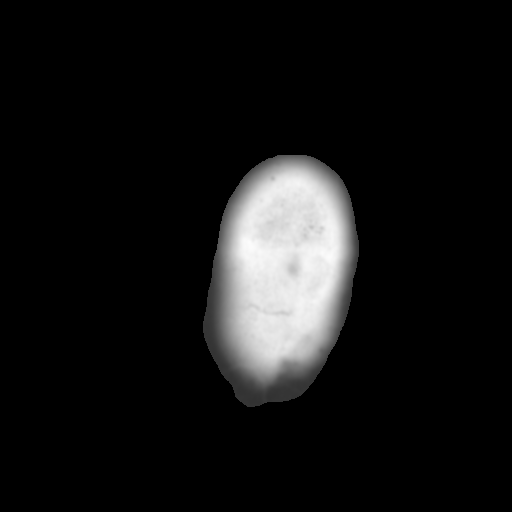

[Series 5: head 3.0 mpr cor · coronal · 0.31mm/px · 3 of 67 slices shown]
[im 23/67  brain]
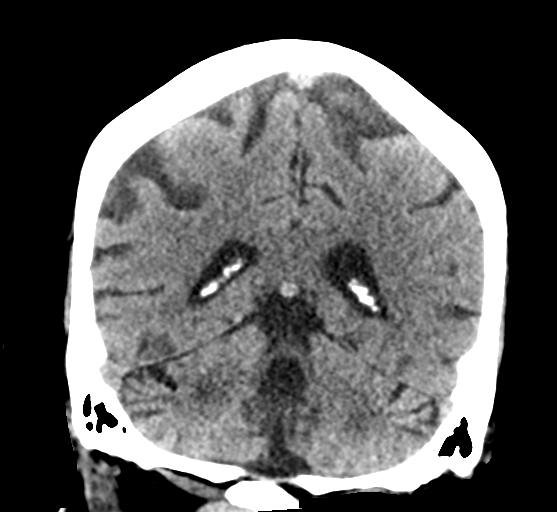
[im 30/67  brain]
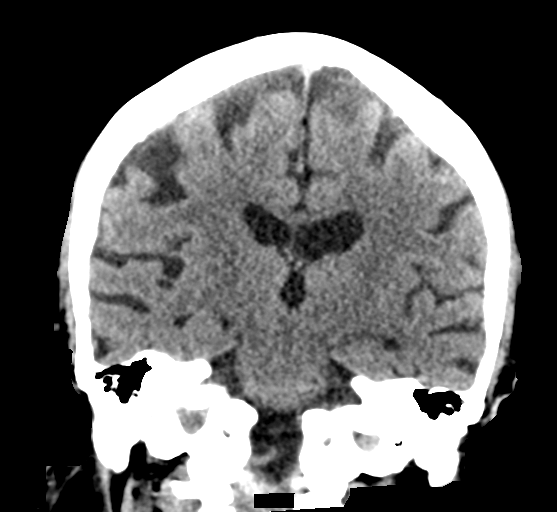
[im 37/67  brain]
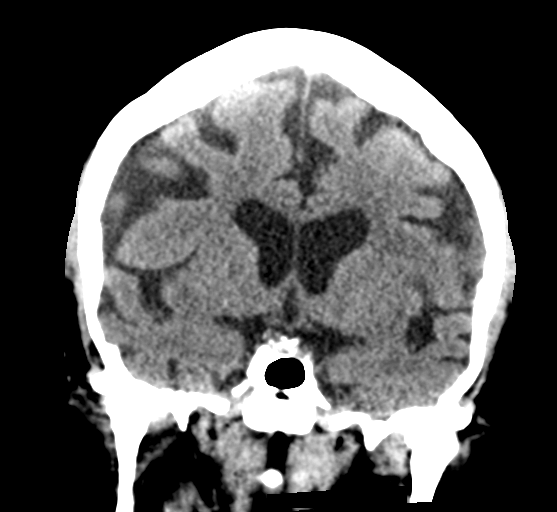

[Series 6: head 3.0 mpr sag · sagittal · 0.31mm/px · 3 of 57 slices shown]
[im 19/57  brain]
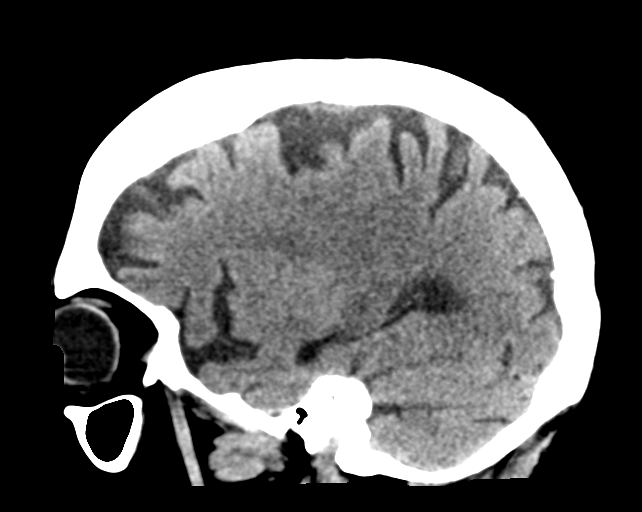
[im 29/57  brain]
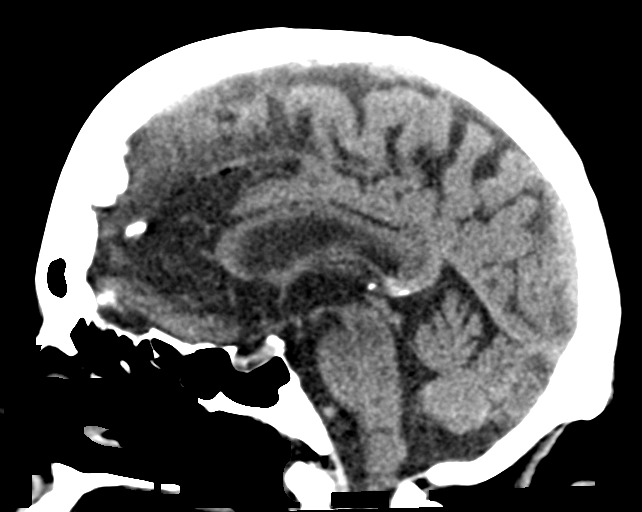
[im 38/57  brain]
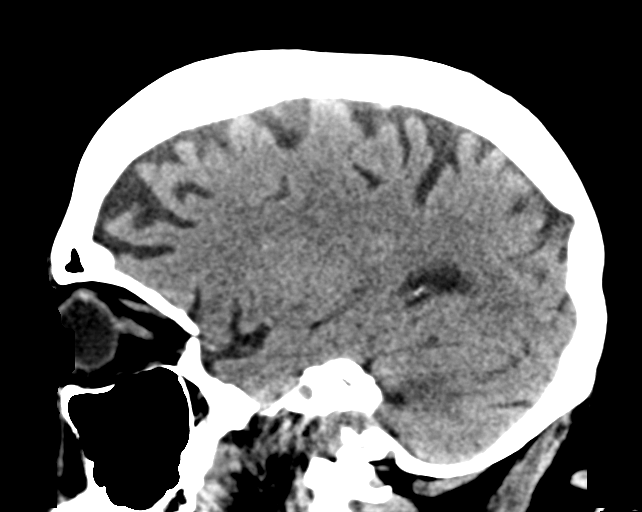

[15 of 47 positions shown; findings below may reference images not displayed]

FINDINGS: Brain: No evidence of acute infarction, hemorrhage, hydrocephalus,
extra-axial collection or mass lesion/mass effect.

Prominence of the ventricles and sulci reflects mild to moderate
cortical volume loss. Mild cerebellar atrophy is noted. Scattered
periventricular white matter change likely reflects small vessel
ischemic microangiopathy.

The brainstem and fourth ventricle are within normal limits. The
basal ganglia are unremarkable in appearance. The cerebral
hemispheres demonstrate grossly normal gray-white differentiation.
No mass effect or midline shift is seen.

Vascular: No hyperdense vessel or unexpected calcification.

Skull: There is no evidence of fracture; visualized osseous
structures are unremarkable in appearance.

Sinuses/Orbits: The orbits are within normal limits. The paranasal
sinuses and mastoid air cells are well-aerated.

Other: No significant soft tissue abnormalities are seen.
IMPRESSION: 1. No acute intracranial pathology seen on CT.
2. Mild to moderate cortical volume loss and scattered small vessel
ischemic microangiopathy.

## 2018-05-12 IMAGING — RF DG SWALLOWING FUNCTION
1 series · 17 of 24 positions shown · non-contrast
Comparison: None.

CLINICAL DATA: Poor appetite.  Dysphagia.  Throat clearing.

EXAM:
MODIFIED BARIUM SWALLOW
TECHNIQUE: Different consistencies of barium were administered orally to the
patient by the Speech Pathologist. Imaging of the pharynx was
performed in the lateral projection.
FLUOROSCOPY TIME:  Fluoroscopy Time:  2 minutes, 41 seconds.
Number of Acquired Spot Images: None.

[Series 1: run · 28 acquisitions, 17 frames shown]
[im 1/28]
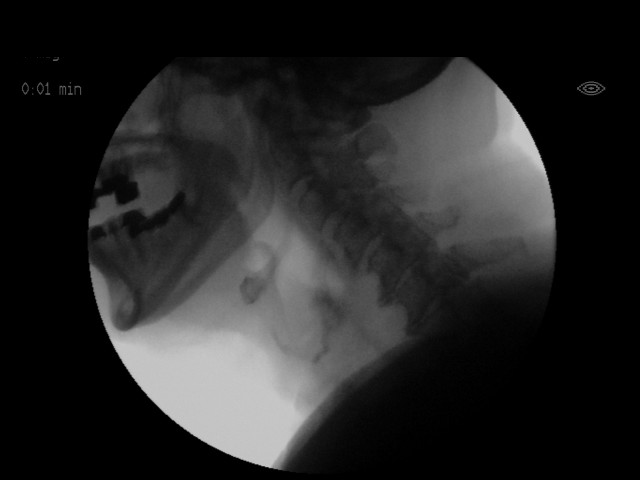
[im 3/28]
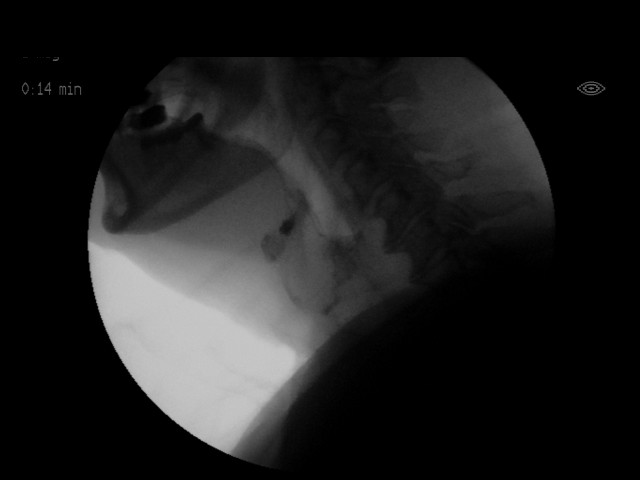
[im 4/28]
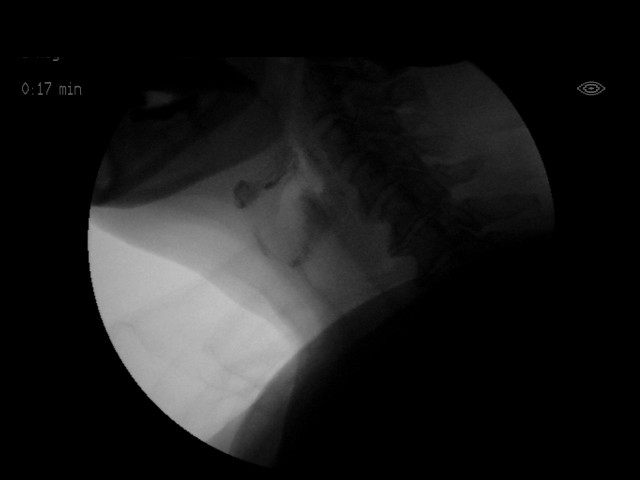
[im 5/28]
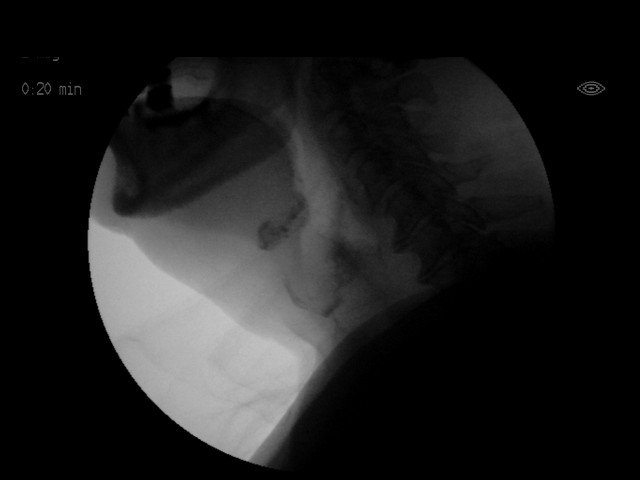
[im 8/28]
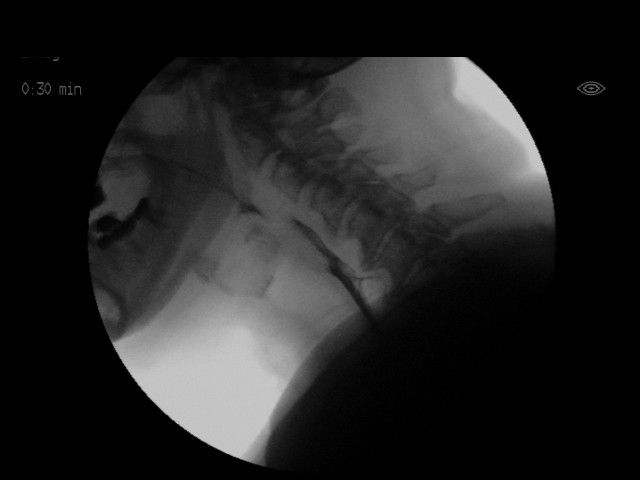
[im 9/28]
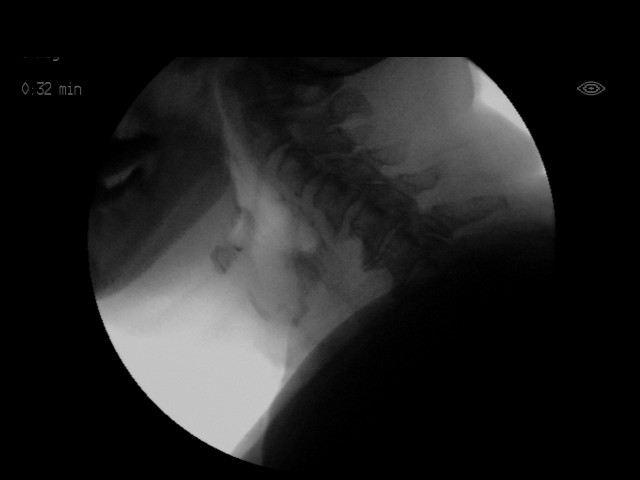
[im 11/28]
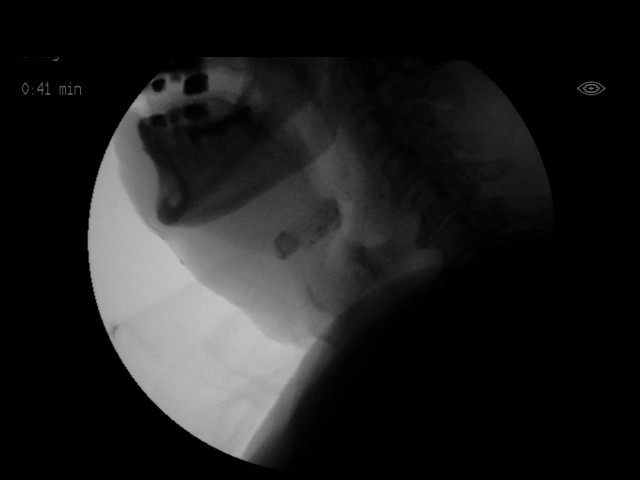
[im 12/28]
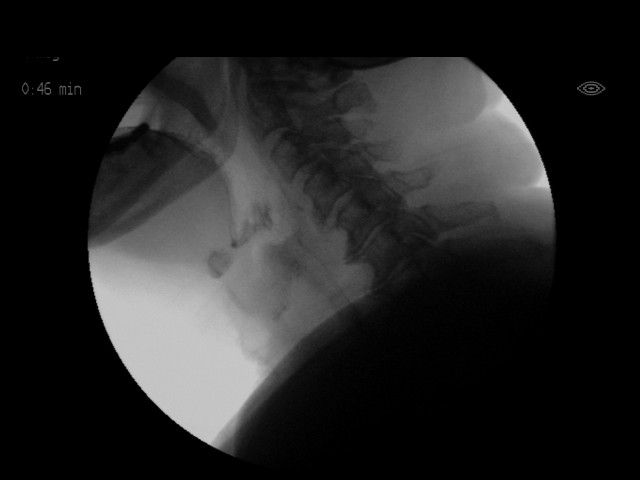
[im 15/28]
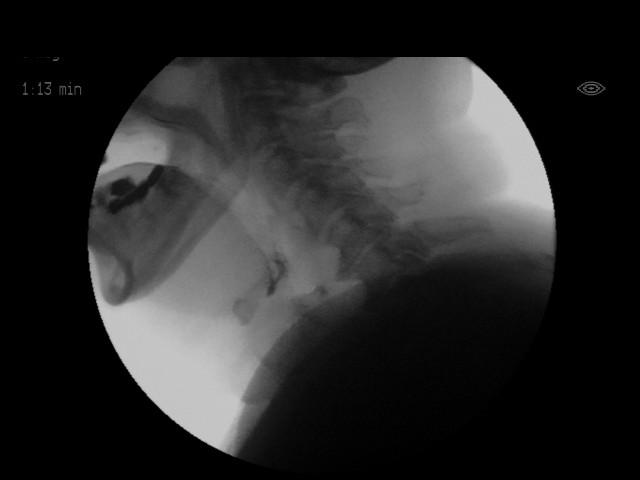
[im 16/28]
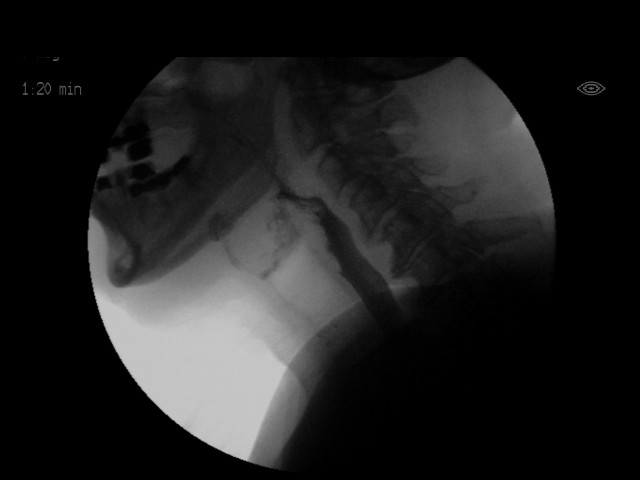
[im 17/28]
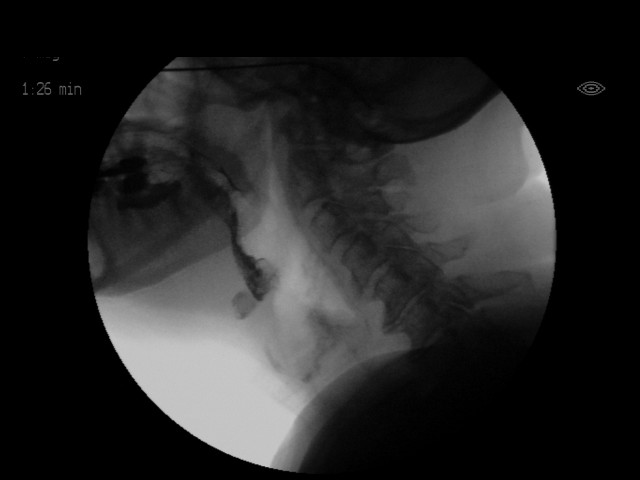
[im 19/28]
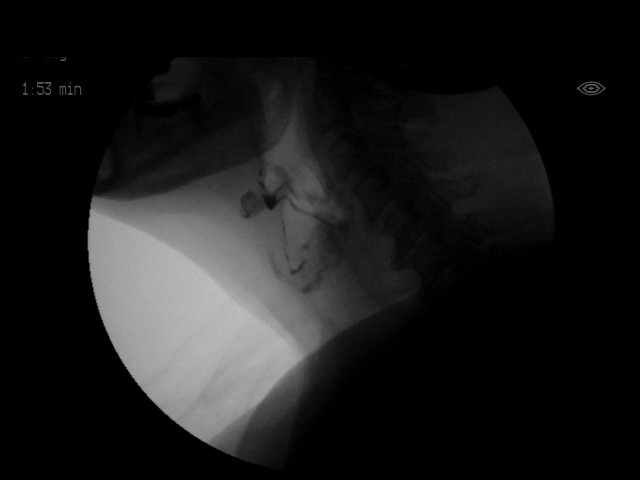
[im 20/28]
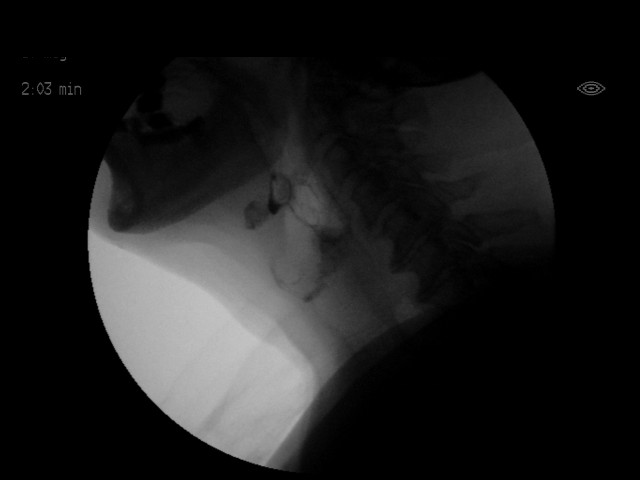
[im 23/28]
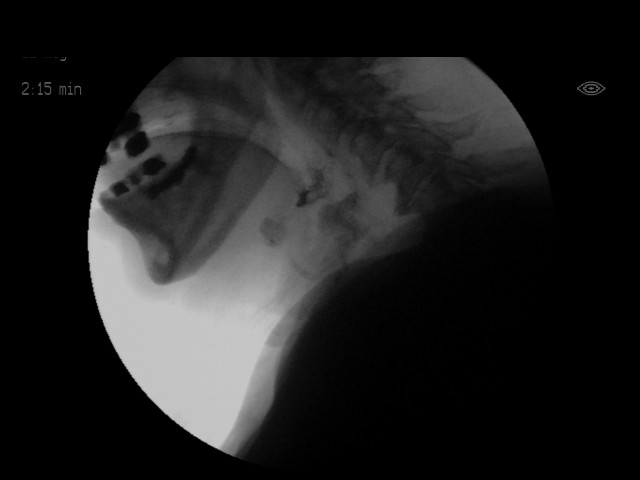
[im 24/28]
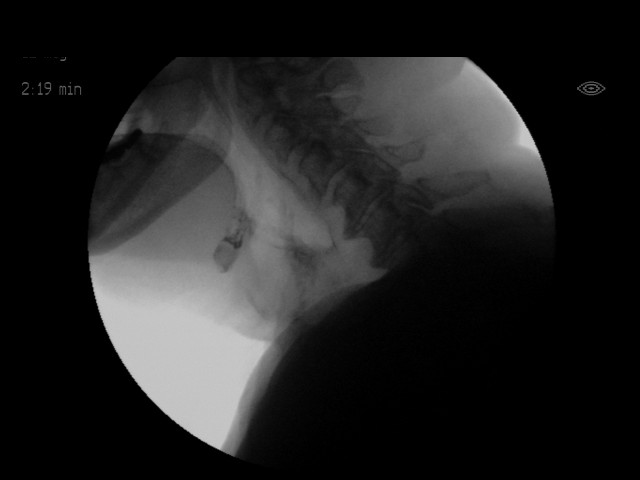
[im 25/28]
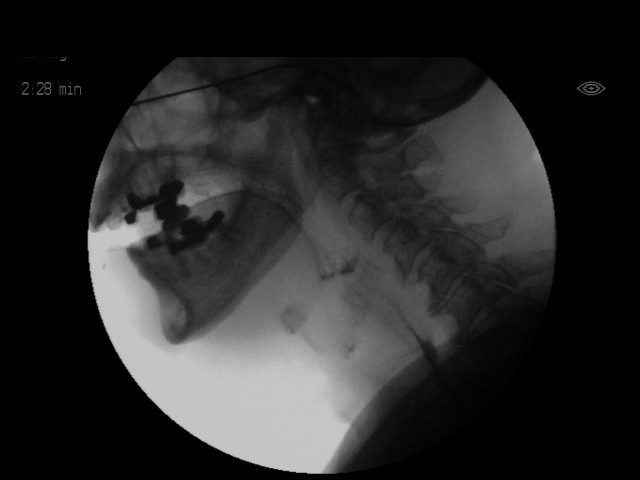
[im 28/28]
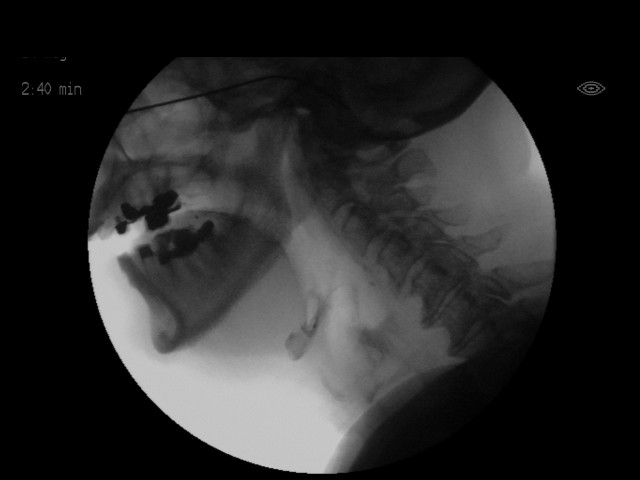

[17 of 24 positions shown; findings below may reference images not displayed]

FINDINGS: Thin liquid- Aspiration and post swallowing retention in the
vallecula.

Nectar thick liquid- Flash penetration and post swallowing retention
in the valleculae.

Nepomuceno?Randolf Baser swallowing retention in the vallecula. Otherwise within
normal limits.

Nepomuceno?Lipe with cracker- Post swallowing retention in the vallecula.
Otherwise within normal limits
IMPRESSION: 1. Abnormal swallow study with aspiration with thin liquids and
flash penetrations with nectar thick liquids.

Please refer to the Speech Pathologists report for complete details
and recommendations.
